# Patient Record
Sex: Male | Born: 1945 | Race: White | Hispanic: No | Marital: Married | Smoking: Never smoker
Health system: Southern US, Community
[De-identification: ages and names within clinical notes are randomized; demographics above are authoritative.]

## PROBLEM LIST (undated history)

## (undated) DIAGNOSIS — I10 Essential (primary) hypertension: Secondary | ICD-10-CM

## (undated) DIAGNOSIS — J309 Allergic rhinitis, unspecified: Secondary | ICD-10-CM

## (undated) DIAGNOSIS — I219 Acute myocardial infarction, unspecified: Secondary | ICD-10-CM

## (undated) DIAGNOSIS — E7800 Pure hypercholesterolemia, unspecified: Secondary | ICD-10-CM

## (undated) DIAGNOSIS — E119 Type 2 diabetes mellitus without complications: Secondary | ICD-10-CM

## (undated) DIAGNOSIS — I2510 Atherosclerotic heart disease of native coronary artery without angina pectoris: Secondary | ICD-10-CM

## (undated) DIAGNOSIS — E785 Hyperlipidemia, unspecified: Secondary | ICD-10-CM

## (undated) HISTORY — PX: CORONARY ARTERY BYPASS GRAFT: SHX141

## (undated) HISTORY — DX: Pure hypercholesterolemia, unspecified: E78.00

## (undated) HISTORY — DX: Allergic rhinitis, unspecified: J30.9

## (undated) HISTORY — PX: CORONARY ANGIOPLASTY WITH STENT PLACEMENT: SHX49

---

## 2000-07-22 ENCOUNTER — Encounter: Payer: Self-pay | Admitting: Emergency Medicine

## 2000-07-22 ENCOUNTER — Inpatient Hospital Stay (HOSPITAL_COMMUNITY): Admission: EM | Admit: 2000-07-22 | Discharge: 2000-07-27 | Payer: Self-pay | Admitting: Emergency Medicine

## 2000-07-26 ENCOUNTER — Encounter: Payer: Self-pay | Admitting: Cardiology

## 2000-08-11 ENCOUNTER — Encounter (HOSPITAL_COMMUNITY): Admission: RE | Admit: 2000-08-11 | Discharge: 2000-11-09 | Payer: Self-pay | Admitting: Interventional Cardiology

## 2000-08-12 ENCOUNTER — Encounter: Admission: RE | Admit: 2000-08-12 | Discharge: 2000-11-10 | Payer: Self-pay | Admitting: Internal Medicine

## 2003-02-06 ENCOUNTER — Ambulatory Visit (HOSPITAL_COMMUNITY): Admission: RE | Admit: 2003-02-06 | Discharge: 2003-02-07 | Payer: Self-pay | Admitting: Interventional Cardiology

## 2003-02-07 ENCOUNTER — Encounter: Payer: Self-pay | Admitting: Interventional Cardiology

## 2003-02-10 ENCOUNTER — Inpatient Hospital Stay (HOSPITAL_COMMUNITY)
Admission: RE | Admit: 2003-02-10 | Discharge: 2003-02-14 | Payer: Self-pay | Admitting: Thoracic Surgery (Cardiothoracic Vascular Surgery)

## 2003-02-10 ENCOUNTER — Encounter: Payer: Self-pay | Admitting: Thoracic Surgery (Cardiothoracic Vascular Surgery)

## 2003-02-11 ENCOUNTER — Encounter: Payer: Self-pay | Admitting: Thoracic Surgery (Cardiothoracic Vascular Surgery)

## 2003-02-12 ENCOUNTER — Encounter: Payer: Self-pay | Admitting: Thoracic Surgery (Cardiothoracic Vascular Surgery)

## 2003-03-13 ENCOUNTER — Encounter (HOSPITAL_COMMUNITY): Admission: RE | Admit: 2003-03-13 | Discharge: 2003-06-11 | Payer: Self-pay | Admitting: Interventional Cardiology

## 2011-05-20 ENCOUNTER — Inpatient Hospital Stay (INDEPENDENT_AMBULATORY_CARE_PROVIDER_SITE_OTHER)
Admission: RE | Admit: 2011-05-20 | Discharge: 2011-05-20 | Disposition: A | Discharge status: Home / Self Care | Payer: Managed Care, Other (non HMO) | Source: Ambulatory Visit | Attending: Emergency Medicine | Admitting: Emergency Medicine

## 2011-05-20 DIAGNOSIS — M799 Soft tissue disorder, unspecified: Secondary | ICD-10-CM

## 2011-05-20 DIAGNOSIS — R7989 Other specified abnormal findings of blood chemistry: Secondary | ICD-10-CM

## 2011-05-20 LAB — GLUCOSE, CAPILLARY: Glucose-Capillary: 268 mg/dL — ABNORMAL HIGH (ref 70–99)

## 2013-06-27 ENCOUNTER — Emergency Department (HOSPITAL_COMMUNITY)
Admission: EM | Admit: 2013-06-27 | Discharge: 2013-06-28 | Disposition: A | Discharge status: Home / Self Care | Payer: Medicare Other | Attending: Emergency Medicine | Admitting: Emergency Medicine

## 2013-06-27 ENCOUNTER — Encounter (HOSPITAL_COMMUNITY): Payer: Self-pay

## 2013-06-27 ENCOUNTER — Emergency Department (HOSPITAL_COMMUNITY): Payer: Medicare Other

## 2013-06-27 ENCOUNTER — Emergency Department (INDEPENDENT_AMBULATORY_CARE_PROVIDER_SITE_OTHER)
Admission: EM | Admit: 2013-06-27 | Discharge: 2013-06-27 | Disposition: A | Discharge status: Nursing Home (Includes ICF) | Payer: Medicare Other | Source: Home / Self Care

## 2013-06-27 ENCOUNTER — Encounter (HOSPITAL_COMMUNITY): Payer: Self-pay | Admitting: *Deleted

## 2013-06-27 DIAGNOSIS — M949 Disorder of cartilage, unspecified: Secondary | ICD-10-CM

## 2013-06-27 DIAGNOSIS — M546 Pain in thoracic spine: Secondary | ICD-10-CM | POA: Insufficient documentation

## 2013-06-27 DIAGNOSIS — M549 Dorsalgia, unspecified: Secondary | ICD-10-CM

## 2013-06-27 DIAGNOSIS — M898X1 Other specified disorders of bone, shoulder: Secondary | ICD-10-CM

## 2013-06-27 DIAGNOSIS — E785 Hyperlipidemia, unspecified: Secondary | ICD-10-CM | POA: Insufficient documentation

## 2013-06-27 DIAGNOSIS — R197 Diarrhea, unspecified: Secondary | ICD-10-CM

## 2013-06-27 DIAGNOSIS — Z7982 Long term (current) use of aspirin: Secondary | ICD-10-CM | POA: Insufficient documentation

## 2013-06-27 DIAGNOSIS — M899 Disorder of bone, unspecified: Secondary | ICD-10-CM

## 2013-06-27 DIAGNOSIS — I2510 Atherosclerotic heart disease of native coronary artery without angina pectoris: Secondary | ICD-10-CM | POA: Insufficient documentation

## 2013-06-27 DIAGNOSIS — Z79899 Other long term (current) drug therapy: Secondary | ICD-10-CM | POA: Insufficient documentation

## 2013-06-27 DIAGNOSIS — E119 Type 2 diabetes mellitus without complications: Secondary | ICD-10-CM | POA: Insufficient documentation

## 2013-06-27 DIAGNOSIS — Z951 Presence of aortocoronary bypass graft: Secondary | ICD-10-CM | POA: Insufficient documentation

## 2013-06-27 DIAGNOSIS — R9431 Abnormal electrocardiogram [ECG] [EKG]: Secondary | ICD-10-CM | POA: Insufficient documentation

## 2013-06-27 DIAGNOSIS — I252 Old myocardial infarction: Secondary | ICD-10-CM | POA: Insufficient documentation

## 2013-06-27 HISTORY — DX: Essential (primary) hypertension: I10

## 2013-06-27 HISTORY — DX: Atherosclerotic heart disease of native coronary artery without angina pectoris: I25.10

## 2013-06-27 HISTORY — DX: Hyperlipidemia, unspecified: E78.5

## 2013-06-27 HISTORY — DX: Type 2 diabetes mellitus without complications: E11.9

## 2013-06-27 HISTORY — DX: Acute myocardial infarction, unspecified: I21.9

## 2013-06-27 LAB — BASIC METABOLIC PANEL
BUN: 53 mg/dL — ABNORMAL HIGH (ref 6–23)
Calcium: 9.5 mg/dL (ref 8.4–10.5)
Creatinine, Ser: 1.75 mg/dL — ABNORMAL HIGH (ref 0.50–1.35)
GFR calc Af Amer: 45 mL/min — ABNORMAL LOW (ref >90)

## 2013-06-27 LAB — CBC
HCT: 37.9 % — ABNORMAL LOW (ref 39.0–52.0)
MCH: 28.8 pg (ref 26.0–34.0)
MCHC: 33.8 g/dL (ref 30.0–36.0)
MCV: 85.2 fL (ref 78.0–100.0)
Platelets: 185 10*3/uL (ref 150–400)
RDW: 13.1 % (ref 11.5–15.5)

## 2013-06-27 LAB — POCT I-STAT TROPONIN I: Troponin i, poc: 0.04 ng/mL (ref 0.00–0.08)

## 2013-06-27 MED ORDER — SODIUM CHLORIDE 0.9 % IV SOLN
Freq: Once | INTRAVENOUS | Status: AC
  Administered 2013-06-27: 19:00:00 via INTRAVENOUS

## 2013-06-27 NOTE — ED Notes (Signed)
Over past 48 hrs, describes intermittent "debilitating, unbearable" squeezing pain in mid-upper back, just below neck; area is non-tender to palpation.  When severe, the pain does not change with any movement or position changes.  Denies any diaphoresis, nausea, or chest pain associated with it.  Pain lasts for approx 30 min until Advil starts working, then pain goes completely away for approx 3 hrs.  Also c/o intermittent episodes watery diarrhea over past 48 hrs without any n/v or fevers.  Has taken Imodium.  Is non-compliant with metformin due to stomach problems when he used to take it.

## 2013-06-27 NOTE — ED Provider Notes (Signed)
History    CSN: [id number] Arrival date & time 06/27/13  1741  First MD Initiated Contact with Patient 06/27/13 1825     Chief Complaint  Patient presents with  . Back Pain  . Diarrhea   (Consider location/radiation/quality/duration/timing/severity/associated sxs/prior Treatment) HPI Comments: 67 year old male with history of coronary artery disease, untreated type 2 diabetes mellitus, hypertension, hyperlipidemia presents with severe squeezing pain in the upper back between the shoulder blades with any exertion for the past 48 hours as well as intermittent diarrhea for the past 48 hours. He says this pain is debilitating and causes him to have to lay down. Takes Advil and the pain goes away after about 30 minutes. He has never experienced anything like this before. He says the pain occurs with any exertion and is relieved by rest. He denies chest pain, dizziness, nausea, and diaphoresis.  Patient is a 67 y.o. male presenting with back pain and diarrhea.  Back Pain Associated symptoms: no abdominal pain, no chest pain, no dysuria, no fever and no weakness   Diarrhea Associated symptoms: no abdominal pain, no arthralgias, no chills, no fever, no myalgias and no vomiting    Past Medical History  Diagnosis Date  . Coronary artery disease   . Acute MI     x2  . Hypertension   . Diabetes mellitus without complication   . Hyperlipidemia    Past Surgical History  Procedure Laterality Date  . Coronary artery bypass graft      multi-vessel 2004  . Coronary angioplasty with stent placement     No family history on file. History  Substance Use Topics  . Smoking status: Never Smoker   . Smokeless tobacco: Not on file  . Alcohol Use: No    Review of Systems  Constitutional: Negative for fever, chills and fatigue.  HENT: Negative for sore throat, neck pain and neck stiffness.   Eyes: Negative for visual disturbance.  Respiratory: Negative for cough and shortness of breath.    Cardiovascular: Negative for chest pain, palpitations and leg swelling.  Gastrointestinal: Positive for diarrhea. Negative for nausea, vomiting, abdominal pain and constipation.  Genitourinary: Negative for dysuria, urgency, frequency and hematuria.  Musculoskeletal: Positive for back pain. Negative for myalgias and arthralgias.  Skin: Negative for rash.  Neurological: Negative for dizziness, weakness and light-headedness.    Allergies  Review of patient's allergies indicates no known allergies.  Home Medications   Current Outpatient Rx  Name  Route  Sig  Dispense  Refill  . aspirin 81 MG tablet   Oral   Take 81 mg by mouth daily.         . hydrochlorothiazide (HYDRODIURIL) 25 MG tablet   Oral   Take 25 mg by mouth daily.         Marland Kitchen lisinopril (PRINIVIL,ZESTRIL) 40 MG tablet   Oral   Take 40 mg by mouth daily.         . metoprolol (LOPRESSOR) 50 MG tablet   Oral   Take 50 mg by mouth.         . simvastatin (ZOCOR) 40 MG tablet   Oral   Take 40 mg by mouth every evening.          BP 124/97  Pulse 71  Temp(Src) 98.6 F (37 C) (Oral)  Resp 18  SpO2 100% Physical Exam  Constitutional: He is oriented to person, place, and time. He appears well-developed and well-nourished. No distress.  HENT:  Head: Normocephalic and atraumatic.  Eyes: EOM are normal. Pupils are equal, round, and reactive to light.  Cardiovascular: Normal rate and regular rhythm.  Exam reveals no gallop and no friction rub.   No murmur heard. Midline scar from previous open thoracotomy from CABG  Pulmonary/Chest: Effort normal and breath sounds normal. No respiratory distress. He has no wheezes. He has no rales.  Neurological: He is oriented to person, place, and time.  Skin: Skin is warm and dry. No rash noted.  Psychiatric: He has a normal mood and affect. Judgment normal.    ED Course  Procedures (including critical care time) Labs Reviewed - No data to display No results found. 1.  Shoulder blade pain   2. Diarrhea    The EKG shows T-wave flattening, Q waves with borderline significant ST elevations and T2 and T3, with with T-wave inversions in 1 and aVL MDM  With this patient's history and EKG, he needs to be worked up in the emergency department for potential cardiac etiology of his pain. Transferring via [facility]  Graylon Good, PA-C 06/27/13 1854

## 2013-06-27 NOTE — ED Notes (Signed)
Report given to [facility]. 

## 2013-06-27 NOTE — ED Notes (Signed)
Patient presents to ED via [facility]. Pt was seen at Urgent Care for intermittent pain in upper back. Urgent care noted abnormalities to pt EKG. Pt transported to ED from Urgent care for same complaints. Pt states that the intermittent pain feels like his "muscles are twisting." A&O x4 upon arrival. EKG being performed at this time.

## 2013-06-27 NOTE — ED Provider Notes (Addendum)
History    CSN: [id number] Arrival date & time 06/27/13  1954  First MD Initiated Contact with Patient 06/27/13 1956     Chief Complaint  Patient presents with  . Back Pain  . Abnormal ECG   (Consider location/radiation/quality/duration/timing/severity/associated sxs/prior Treatment) HPI Comments: Patient GE for evaluation of intrascapular back pain. Patient initially presented to the urgent care S., was referred to me for further evaluation. Patient does have significant cardiac history including stenting followed by bypass in 2004. Patient has not had any anterior chest pain or shortness of breath. He reports a severe pain between his shoulder blades. It occurs with exertion and gets better if he sits down and rests. He reports that when the pain is present, it is not exacerbated by bending, twisting or pressing on the area. He also has noticed some improvement with Advil. Patient has not had a nausea, vomiting or diaphoresis. He has been expressing intermittent watery diarrhea.  Patient is a diabetic. He admits to not taking his metformin because it causes GI distress.  Patient is a 67 y.o. male presenting with back pain.  Back Pain Associated symptoms: no abdominal pain and no chest pain    Past Medical History  Diagnosis Date  . Coronary artery disease   . Acute MI     x2  . Hypertension   . Diabetes mellitus without complication   . Hyperlipidemia    Past Surgical History  Procedure Laterality Date  . Coronary artery bypass graft      multi-vessel 2004  . Coronary angioplasty with stent placement     No family history on file. History  Substance Use Topics  . Smoking status: Never Smoker   . Smokeless tobacco: Not on file  . Alcohol Use: No    Review of Systems  Respiratory: Negative for shortness of breath.   Cardiovascular: Negative for chest pain, palpitations and leg swelling.  Gastrointestinal: Negative for abdominal pain.  Musculoskeletal: Positive for  back pain.  All other systems reviewed and are negative.    Allergies  Review of patient's allergies indicates no known allergies.  Home Medications   Current Outpatient Rx  Name  Route  Sig  Dispense  Refill  . hydrochlorothiazide (HYDRODIURIL) 25 MG tablet   Oral   Take 25 mg by mouth daily.         Marland Kitchen lisinopril (PRINIVIL,ZESTRIL) 40 MG tablet   Oral   Take 40 mg by mouth daily.         . metoprolol (LOPRESSOR) 50 MG tablet   Oral   Take 50 mg by mouth.         . simvastatin (ZOCOR) 40 MG tablet   Oral   Take 40 mg by mouth every evening.          BP 149/75  Pulse 64  Temp(Src) 97.9 F (36.6 C) (Oral)  Resp 11  SpO2 97% Physical Exam  Constitutional: He is oriented to person, place, and time. He appears well-developed and well-nourished. No distress.  HENT:  Head: Normocephalic and atraumatic.  Right Ear: Hearing normal.  Left Ear: Hearing normal.  Nose: Nose normal.  Mouth/Throat: Oropharynx is clear and moist and mucous membranes are normal.  Eyes: Conjunctivae and EOM are normal. Pupils are equal, round, and reactive to light.  Neck: Normal range of motion. Neck supple.  Cardiovascular: Regular rhythm, S1 normal and S2 normal.  Exam reveals no gallop and no friction rub.   No murmur heard. Pulmonary/Chest: Effort  normal and breath sounds normal. No respiratory distress. He exhibits no tenderness.  Abdominal: Soft. Normal appearance and bowel sounds are normal. There is no hepatosplenomegaly. There is no tenderness. There is no rebound, no guarding, no tenderness at McBurney's point and negative Murphy's sign. No hernia.  Musculoskeletal: Normal range of motion.  Neurological: He is alert and oriented to person, place, and time. He has normal strength. No cranial nerve deficit or sensory deficit. Coordination normal. GCS eye subscore is 4. GCS verbal subscore is 5. GCS motor subscore is 6.  Skin: Skin is warm, dry and intact. No rash noted. No cyanosis.   Psychiatric: He has a normal mood and affect. His speech is normal and behavior is normal. Thought content normal.    ED Course  Procedures (including critical care time)  EKG:  Date: 06/27/2013  Rate: 60  Rhythm: normal sinus rhythm  QRS Axis: normal  Intervals: normal  ST/T Wave abnormalities: nonspecific ST/T changes and anterior Q waves  Conduction Disutrbances:none  Narrative Interpretation:   Old EKG Reviewed: none available    Labs Reviewed  CBC - Abnormal; Notable for the following:    Hemoglobin 12.8 (*)    HCT 37.9 (*)    All other components within normal limits  BASIC METABOLIC PANEL - Abnormal; Notable for the following:    Glucose, Bld 231 (*)    BUN 53 (*)    Creatinine, Ser 1.75 (*)    GFR calc non Af Amer 39 (*)    GFR calc Af Amer 45 (*)    All other components within normal limits  POCT I-STAT TROPONIN I   Dg Chest 2 View  06/28/2013   *RADIOLOGY REPORT*  Clinical Data: Diarrhea and interscapular pain.  CHEST - 2 VIEW  Comparison: None.  Findings: Two views of the chest demonstrate elevation of the right hemidiaphragm.  Lungs are clear bilaterally.  Heart size is within normal limits.  Median sternotomy wires present.  IMPRESSION: No acute chest abnormality.  Elevation of the right hemidiaphragm.   Original Report Authenticated By: Richarda Overlie, M.D.    Diagnosis: Intrascapular back pain  MDM  Patient presents to the ER for 2 days worth of intermittent pain in the center of his back, between the shoulder blades, that occurs with exertion. It improves with rest but also improves with Advil. Patient has a history of coronary artery disease, status post bypass in 2004. Patient's EKG was nonspecific. Troponin was negative.   Case discussed with Doctor Shirlee Latch, on call cardiology. Because the patient's pain is mainly exertional, not at rest and is not currently having any pain, Doctor Shirlee Latch felt that the patient had a second troponin if negative, but discharged  and followup in the office in the morning.    Gilda Crease, MD 06/27/13 1610  Gilda Crease, MD 06/28/13 [phone]

## 2013-06-27 NOTE — ED Notes (Signed)
Report called to Thayer Ohm, ED Charge RN.

## 2013-06-28 ENCOUNTER — Ambulatory Visit
Admission: RE | Admit: 2013-06-28 | Discharge: 2013-06-28 | Disposition: A | Discharge status: Home / Self Care | Payer: Medicare Other | Source: Ambulatory Visit | Attending: Interventional Cardiology | Admitting: Interventional Cardiology

## 2013-06-28 ENCOUNTER — Other Ambulatory Visit: Payer: Self-pay | Admitting: Interventional Cardiology

## 2013-06-28 DIAGNOSIS — M1990 Unspecified osteoarthritis, unspecified site: Secondary | ICD-10-CM

## 2013-06-28 NOTE — ED Notes (Signed)
MD Pollina at bedside to update patient

## 2013-06-29 NOTE — ED Provider Notes (Signed)
Medical screening examination/treatment/procedure(s) were performed by a resident physician or non-physician practitioner and as the supervising physician I was immediately available for consultation/collaboration.  Clementeen Graham, MD    Rodolph Bong, MD 06/29/13 [phone]

## 2013-09-10 ENCOUNTER — Other Ambulatory Visit: Payer: Self-pay | Admitting: Interventional Cardiology

## 2013-09-10 DIAGNOSIS — E7800 Pure hypercholesterolemia, unspecified: Secondary | ICD-10-CM

## 2013-09-19 ENCOUNTER — Other Ambulatory Visit: Payer: Medicare Other | Admitting: *Deleted

## 2013-09-19 ENCOUNTER — Other Ambulatory Visit: Payer: Self-pay | Admitting: Cardiology

## 2013-09-19 DIAGNOSIS — E7800 Pure hypercholesterolemia, unspecified: Secondary | ICD-10-CM

## 2013-09-21 ENCOUNTER — Ambulatory Visit (INDEPENDENT_AMBULATORY_CARE_PROVIDER_SITE_OTHER): Payer: Medicare Other | Admitting: *Deleted

## 2013-09-21 DIAGNOSIS — E7800 Pure hypercholesterolemia, unspecified: Secondary | ICD-10-CM

## 2013-09-21 LAB — LIPID PANEL
Cholesterol: 146 mg/dL (ref 0–200)
HDL: 32.7 mg/dL — ABNORMAL LOW (ref >39.00)
LDL Cholesterol: 92 mg/dL (ref 0–99)
Total CHOL/HDL Ratio: 4
Triglycerides: 108 mg/dL (ref 0.0–149.0)
VLDL: 21.6 mg/dL (ref 0.0–40.0)

## 2013-09-21 LAB — ALT: ALT: 17 U/L (ref 0–53)

## 2013-09-22 ENCOUNTER — Other Ambulatory Visit: Payer: Medicare Other

## 2013-09-23 ENCOUNTER — Other Ambulatory Visit: Payer: Medicare Other

## 2014-01-10 ENCOUNTER — Encounter: Payer: Self-pay | Admitting: *Deleted

## 2014-01-10 ENCOUNTER — Encounter: Payer: Self-pay | Admitting: Interventional Cardiology

## 2014-01-10 DIAGNOSIS — I10 Essential (primary) hypertension: Secondary | ICD-10-CM | POA: Insufficient documentation

## 2014-01-10 DIAGNOSIS — E785 Hyperlipidemia, unspecified: Secondary | ICD-10-CM | POA: Insufficient documentation

## 2014-01-10 DIAGNOSIS — E119 Type 2 diabetes mellitus without complications: Secondary | ICD-10-CM | POA: Insufficient documentation

## 2014-01-10 DIAGNOSIS — I252 Old myocardial infarction: Secondary | ICD-10-CM | POA: Insufficient documentation

## 2014-01-10 DIAGNOSIS — I2510 Atherosclerotic heart disease of native coronary artery without angina pectoris: Secondary | ICD-10-CM | POA: Insufficient documentation

## 2014-01-19 ENCOUNTER — Encounter (INDEPENDENT_AMBULATORY_CARE_PROVIDER_SITE_OTHER): Payer: Self-pay

## 2014-01-19 ENCOUNTER — Encounter: Payer: Self-pay | Admitting: Interventional Cardiology

## 2014-01-19 ENCOUNTER — Ambulatory Visit (INDEPENDENT_AMBULATORY_CARE_PROVIDER_SITE_OTHER): Payer: Medicare HMO | Admitting: Interventional Cardiology

## 2014-01-19 ENCOUNTER — Other Ambulatory Visit (INDEPENDENT_AMBULATORY_CARE_PROVIDER_SITE_OTHER): Payer: Medicare HMO

## 2014-01-19 VITALS — BP 134/82 | HR 61 | Ht 71.0 in | Wt 245.0 lb

## 2014-01-19 DIAGNOSIS — E785 Hyperlipidemia, unspecified: Secondary | ICD-10-CM

## 2014-01-19 DIAGNOSIS — R0609 Other forms of dyspnea: Secondary | ICD-10-CM

## 2014-01-19 DIAGNOSIS — I5032 Chronic diastolic (congestive) heart failure: Secondary | ICD-10-CM

## 2014-01-19 DIAGNOSIS — I2510 Atherosclerotic heart disease of native coronary artery without angina pectoris: Secondary | ICD-10-CM

## 2014-01-19 DIAGNOSIS — R609 Edema, unspecified: Secondary | ICD-10-CM

## 2014-01-19 DIAGNOSIS — E7800 Pure hypercholesterolemia, unspecified: Secondary | ICD-10-CM

## 2014-01-19 DIAGNOSIS — R0989 Other specified symptoms and signs involving the circulatory and respiratory systems: Secondary | ICD-10-CM

## 2014-01-19 DIAGNOSIS — R0600 Dyspnea, unspecified: Secondary | ICD-10-CM

## 2014-01-19 DIAGNOSIS — I5042 Chronic combined systolic (congestive) and diastolic (congestive) heart failure: Secondary | ICD-10-CM | POA: Insufficient documentation

## 2014-01-19 DIAGNOSIS — E119 Type 2 diabetes mellitus without complications: Secondary | ICD-10-CM

## 2014-01-19 DIAGNOSIS — R600 Localized edema: Secondary | ICD-10-CM | POA: Insufficient documentation

## 2014-01-19 LAB — LIPID PANEL
CHOL/HDL RATIO: 4
Cholesterol: 126 mg/dL (ref 0–200)
HDL: 32.5 mg/dL — AB (ref >39.00)
LDL Cholesterol: 76 mg/dL (ref 0–99)
Triglycerides: 86 mg/dL (ref 0.0–149.0)
VLDL: 17.2 mg/dL (ref 0.0–40.0)

## 2014-01-19 LAB — BASIC METABOLIC PANEL
BUN: 23 mg/dL (ref 6–23)
CHLORIDE: 106 meq/L (ref 96–112)
CO2: 27 mEq/L (ref 19–32)
Calcium: 8.8 mg/dL (ref 8.4–10.5)
Creatinine, Ser: 1.2 mg/dL (ref 0.4–1.5)
GFR: 62.48 mL/min (ref >60.00)
Glucose, Bld: 246 mg/dL — ABNORMAL HIGH (ref 70–99)
Potassium: 4.2 mEq/L (ref 3.5–5.1)
SODIUM: 140 meq/L (ref 135–145)

## 2014-01-19 LAB — ALT: ALT: 18 U/L (ref 0–53)

## 2014-01-19 LAB — BRAIN NATRIURETIC PEPTIDE: PRO B NATRI PEPTIDE: 341 pg/mL — AB (ref 0.0–100.0)

## 2014-01-19 MED ORDER — METOPROLOL TARTRATE 50 MG PO TABS: 50.0000 mg | ORAL_TABLET | Freq: Two times a day (BID) | ORAL | Status: DC

## 2014-01-19 MED ORDER — SIMVASTATIN 40 MG PO TABS: 20.0000 mg | ORAL_TABLET | Freq: Every evening | ORAL | Status: DC

## 2014-01-19 MED ORDER — FUROSEMIDE 40 MG PO TABS: 40.0000 mg | ORAL_TABLET | Freq: Every day | ORAL | Status: DC

## 2014-01-19 MED ORDER — LISINOPRIL 40 MG PO TABS: 20.0000 mg | ORAL_TABLET | Freq: Every day | ORAL | Status: AC

## 2014-01-19 NOTE — Patient Instructions (Signed)
STOP Hctz  START Furosemide 40mg  twice daily for 1 week. Then reduce to 40mg  daily.  Your medications have been refilled today  Labs Today: Bmet, Bnp  Your physician has requested that you have an echocardiogram. Echocardiography is a painless test that uses sound waves to create images of your heart. It provides your doctor with information about the size and shape of your heart and how well your heart's chambers and valves are working. This procedure takes approximately one hour. There are no restrictions for this procedure.  Your physician recommends that you schedule a follow-up appointment in: 2 weks with a PA, or NP

## 2014-01-19 NOTE — Progress Notes (Signed)
Patient ID: Hayden Patellalbert Streets, male   DOB: 06/24/1946, 68 y.o.   MRN: [id number]    [address] [place], [place]  [address] Phone: [phone] Fax:  [phone]  Date:  01/19/2014   ID:  Hayden Patellalbert Ripple, DOB 03/13/1946, MRN [id number]  PCP:  No PCP Per Patient   ASSESSMENT:  1. Coronary atherosclerotic heart disease, status post bypass surgery 2. Hypertension, 3. Dyspnea 4. Bilateral lower extremity edema, severe. Rule out pulmonary hypertension  PLAN:  1. Discontinue hydrochlorothiazide and start furosemide 40 mg twice a day and report back about lower extremity swelling in 7 days. 2. Basic metabolic panel and BNP 3. 2-D Doppler echocardiogram to assess for pulmonary hypertension 4. Basic metabolic panel on return 5. 2-3 week followup appointment    SUBJECTIVE: Hayden Wallace is a 68 y.o. male who has a history of CAD and is status post coronary bypass surgery. There has been progressive lower extremity swelling over the past 6 months since he retired from [employer]. He denies chest pain. There is no orthopnea. There is significant lower extremity swelling. He has exertional fatigue. Is not able to exercise .   Wt Readings from Last 3 Encounters:  01/19/14 245 lb (111.131 kg)     Past Medical History  Diagnosis Date  . Coronary artery disease   . Acute MI     x2  . Hypertension   . Diabetes mellitus without complication   . Hyperlipidemia   . Hypercholesteremia   . Allergic rhinitis     Current Outpatient Prescriptions  Medication Sig Dispense Refill  . aspirin EC 81 MG tablet Take 81 mg by mouth daily.      . hydrochlorothiazide (HYDRODIURIL) 25 MG tablet Take 25 mg by mouth daily.      Marland Kitchen. ibuprofen (ADVIL,MOTRIN) 200 MG tablet Take 400 mg by mouth daily as needed for pain.      Marland Kitchen. lisinopril (PRINIVIL,ZESTRIL) 40 MG tablet Take 20 mg by mouth daily.       Marland Kitchen. loperamide (IMODIUM A-D) 2 MG tablet Take 2 mg by mouth 3 (three) times daily as needed for diarrhea  or loose stools.      . metoprolol (LOPRESSOR) 50 MG tablet Take 50 mg by mouth 2 (two) times daily.       . simvastatin (ZOCOR) 40 MG tablet Take 20 mg by mouth every evening.        No current facility-administered medications for this visit.    Allergies:   No Known Allergies  Social History:  The patient  reports that he has never smoked. He does not have any smokeless tobacco history on file. He reports that he does not drink alcohol or use illicit drugs.   ROS:  Please see the history of present illness.   Denies orthopnea. No syncope or palpitations.   All other systems reviewed and negative.   OBJECTIVE: VS:  BP 134/82  Pulse 61  Ht 5\' 11"  (1.803 m)  Wt 245 lb (111.131 kg)  BMI 34.19 kg/m2 Well nourished, well developed, in no acute distress, perfusion than stated age  HEENT: normal Neck: JVD marked elevation tidal of the jaw sitting at 90. Carotid bruit absent  Cardiac:  normal S1, S2; RRR; no murmur Lungs:  clear to auscultation bilaterally, no wheezing, rhonchi or rales Abd: soft, nontender, no hepatomegaly Ext: Edema 4+ bilateral lower extremity edema from ankles to the knees bilaterally. Pulses unable to palpate do to edema  Skin: warm and dry Neuro:  CNs 2-12 intact, no focal abnormalities noted  EKG:  Not performed       Signed, Darci Needle III, MD 01/19/2014 10:11 AM

## 2014-01-24 ENCOUNTER — Other Ambulatory Visit: Payer: Self-pay | Admitting: Interventional Cardiology

## 2014-01-24 ENCOUNTER — Telehealth: Payer: Self-pay

## 2014-01-24 DIAGNOSIS — E785 Hyperlipidemia, unspecified: Secondary | ICD-10-CM

## 2014-01-24 NOTE — Telephone Encounter (Signed)
Message copied by Jarvis NewcomerPARRIS-GODLEY, Mechelle Pates S on Tue Jan 24, 2014  3:36 PM ------      Message from: Verdis PrimeSMITH, HENRY      Created: Fri Jan 20, 2014 11:05 AM       Normal. Repeat in one year ------

## 2014-01-24 NOTE — Telephone Encounter (Signed)
pt home/cell disconnected. unable to get sime one on the line at pt work #. results mailed to pt.

## 2014-01-27 ENCOUNTER — Ambulatory Visit (HOSPITAL_COMMUNITY): Payer: Medicare HMO | Attending: Cardiology | Admitting: Cardiology

## 2014-01-27 ENCOUNTER — Encounter: Payer: Self-pay | Admitting: Cardiology

## 2014-01-27 DIAGNOSIS — I059 Rheumatic mitral valve disease, unspecified: Secondary | ICD-10-CM | POA: Insufficient documentation

## 2014-01-27 DIAGNOSIS — I5032 Chronic diastolic (congestive) heart failure: Secondary | ICD-10-CM

## 2014-01-27 DIAGNOSIS — E119 Type 2 diabetes mellitus without complications: Secondary | ICD-10-CM | POA: Insufficient documentation

## 2014-01-27 DIAGNOSIS — I252 Old myocardial infarction: Secondary | ICD-10-CM

## 2014-01-27 DIAGNOSIS — R600 Localized edema: Secondary | ICD-10-CM

## 2014-01-27 DIAGNOSIS — E785 Hyperlipidemia, unspecified: Secondary | ICD-10-CM | POA: Insufficient documentation

## 2014-01-27 DIAGNOSIS — R0989 Other specified symptoms and signs involving the circulatory and respiratory systems: Principal | ICD-10-CM | POA: Insufficient documentation

## 2014-01-27 DIAGNOSIS — Z951 Presence of aortocoronary bypass graft: Secondary | ICD-10-CM | POA: Insufficient documentation

## 2014-01-27 DIAGNOSIS — I10 Essential (primary) hypertension: Secondary | ICD-10-CM | POA: Insufficient documentation

## 2014-01-27 DIAGNOSIS — R0609 Other forms of dyspnea: Secondary | ICD-10-CM | POA: Insufficient documentation

## 2014-01-27 DIAGNOSIS — I2510 Atherosclerotic heart disease of native coronary artery without angina pectoris: Secondary | ICD-10-CM | POA: Insufficient documentation

## 2014-01-27 NOTE — Progress Notes (Signed)
Echo performed. 

## 2014-02-02 ENCOUNTER — Encounter: Payer: Medicare HMO | Admitting: Physician Assistant

## 2014-02-02 ENCOUNTER — Encounter: Payer: Self-pay | Admitting: Interventional Cardiology

## 2014-02-02 ENCOUNTER — Encounter: Payer: Self-pay | Admitting: Physician Assistant

## 2014-02-02 ENCOUNTER — Ambulatory Visit (INDEPENDENT_AMBULATORY_CARE_PROVIDER_SITE_OTHER): Payer: Medicare HMO | Admitting: Interventional Cardiology

## 2014-02-02 VITALS — BP 138/74 | HR 63 | Ht 71.0 in | Wt 249.0 lb

## 2014-02-02 DIAGNOSIS — I5032 Chronic diastolic (congestive) heart failure: Secondary | ICD-10-CM

## 2014-02-02 DIAGNOSIS — I2510 Atherosclerotic heart disease of native coronary artery without angina pectoris: Secondary | ICD-10-CM

## 2014-02-02 DIAGNOSIS — E785 Hyperlipidemia, unspecified: Secondary | ICD-10-CM

## 2014-02-02 DIAGNOSIS — R600 Localized edema: Secondary | ICD-10-CM

## 2014-02-02 DIAGNOSIS — R609 Edema, unspecified: Secondary | ICD-10-CM

## 2014-02-02 DIAGNOSIS — I252 Old myocardial infarction: Secondary | ICD-10-CM

## 2014-02-02 DIAGNOSIS — I255 Ischemic cardiomyopathy: Secondary | ICD-10-CM | POA: Insufficient documentation

## 2014-02-02 LAB — BASIC METABOLIC PANEL
BUN: 23 mg/dL (ref 6–23)
CHLORIDE: 103 meq/L (ref 96–112)
CO2: 28 mEq/L (ref 19–32)
Calcium: 8.2 mg/dL — ABNORMAL LOW (ref 8.4–10.5)
Creatinine, Ser: 1.4 mg/dL (ref 0.4–1.5)
GFR: 55.16 mL/min — AB (ref >60.00)
Glucose, Bld: 308 mg/dL — ABNORMAL HIGH (ref 70–99)
Potassium: 3.7 mEq/L (ref 3.5–5.1)
Sodium: 136 mEq/L (ref 135–145)

## 2014-02-02 LAB — LIPID PANEL
Cholesterol: 131 mg/dL (ref 0–200)
HDL: 28.7 mg/dL — ABNORMAL LOW (ref >39.00)
LDL CALC: 75 mg/dL (ref 0–99)
Total CHOL/HDL Ratio: 5
Triglycerides: 138 mg/dL (ref 0.0–149.0)
VLDL: 27.6 mg/dL (ref 0.0–40.0)

## 2014-02-02 LAB — ALT: ALT: 20 U/L (ref 0–53)

## 2014-02-02 LAB — TSH: TSH: 3.23 u[IU]/mL (ref 0.35–5.50)

## 2014-02-02 MED ORDER — FUROSEMIDE 80 MG PO TABS: 80.0000 mg | ORAL_TABLET | Freq: Every day | ORAL | Status: DC

## 2014-02-02 MED ORDER — POTASSIUM CHLORIDE CRYS ER 20 MEQ PO TBCR: 20.0000 meq | EXTENDED_RELEASE_TABLET | Freq: Every day | ORAL | Status: DC

## 2014-02-02 NOTE — Progress Notes (Deleted)
[address] [place], [place]  [address] Phone: [phone] Fax:  [phone]  Date:  02/02/2014   ID:  Hayden Wallace, DOB 12/15/1945, MRN [id number]  PCP:  No PCP Per Patient  Cardiologist:  Dr. Verdis PrimeHenry Wallace     History of Present Illness: Hayden Wallace is a 68 y.o. male with a hx of CAD, s/p prior anterior MI with stenting to the LAD and subsequent CABG in 2004, ischemic cardiomyopathy, diabetes, HTN, HL.  Patient was recently seen by Dr. Katrinka Wallace 01/19/14 with increasing LE edema. HCTZ was discontinued and he was placed back on Lasix. Echocardiogram demonstrated an ejection fraction of 40-45%.  ***  ETT-Cardiolite (08/2002): Anteroapical infarct, no ischemia, EF 35% with anteroapical akinesis. Abdominal US (07/2012): No AAA. Echocardiogram (01/27/14): Mild LVH, EF 40-45%, mid anteroseptal, apical septal, apical inferior and true apex akinesis, grade 2 diastolic dysfunction, mildly dilated aortic root (36 mm), MAC, mild MR, mild LAE.     Recent Labs: 06/27/2013: Hemoglobin 12.8*  01/19/2014: ALT 18; Creatinine 1.2; HDL Cholesterol 32.50*; LDL (calc) 76; Potassium 4.2; Pro B Natriuretic peptide (BNP) 341.0*   Wt Readings from Last 3 Encounters:  02/02/14 249 lb (112.946 kg)  01/19/14 245 lb (111.131 kg)     Past Medical History  Diagnosis Date  . Coronary artery disease   . Acute MI     x2  . Hypertension   . Diabetes mellitus without complication   . Hyperlipidemia   . Hypercholesteremia   . Allergic rhinitis     Past Surgical History  Procedure Laterality Date  . Coronary artery bypass graft      multi-vessel 2004  . Coronary angioplasty with stent placement      Current Outpatient Prescriptions  Medication Sig Dispense Refill  . aspirin EC 81 MG tablet Take 81 mg by mouth daily.      . furosemide (LASIX) 40 MG tablet Take 1 tablet (40 mg total) by mouth daily.  30 tablet  11  . ibuprofen (ADVIL,MOTRIN) 200 MG tablet Take 400 mg by mouth daily as needed for  pain.      Marland Kitchen. lisinopril (PRINIVIL,ZESTRIL) 40 MG tablet Take 0.5 tablets (20 mg total) by mouth daily.  15 tablet  11  . loperamide (IMODIUM A-D) 2 MG tablet Take 2 mg by mouth 3 (three) times daily as needed for diarrhea or loose stools.      . metoprolol (LOPRESSOR) 50 MG tablet Take 1 tablet (50 mg total) by mouth 2 (two) times daily.  180 tablet  3  . simvastatin (ZOCOR) 40 MG tablet Take 0.5 tablets (20 mg total) by mouth every evening.  15 tablet  11   No current facility-administered medications for this visit.    Allergies:   Review of patient's allergies indicates no known allergies.   Social History:  The patient  reports that he has never smoked. He does not have any smokeless tobacco history on file. He reports that he does not drink alcohol or use illicit drugs.   Family History:  The patient's family history includes Heart disease in his father and mother.   ROS:  Please see the history of present illness.   ***   All other systems reviewed and negative.   PHYSICAL EXAM: VS:  BP 138/74  Pulse 63  Ht 5\' 11"  (1.803 m)  Wt 249 lb (112.946 kg)  BMI 34.74 kg/m2 Well nourished, well developed, in no acute distress HEENT: normal Neck: no JVD Cardiac:  normal  S1, S2; RRR; no murmur Lungs:  clear to auscultation bilaterally, no wheezing, rhonchi or rales Abd: soft, nontender, no hepatomegaly Ext: no edema Skin: warm and dry Neuro:  CNs 2-12 intact, no focal abnormalities noted  EKG:  ***     ASSESSMENT AND PLAN:  1. *** 2. CAD, s/p Prior MI:  *** 3. Ischemic Cardiomyopathy:  *** 4. Hypertension:  *** 5. Hyperlipidemia:  *** 6. Disposition:  ***  Signed, Tereso Newcomer, PA-C  02/02/2014 8:48 AM

## 2014-02-02 NOTE — Progress Notes (Signed)
Patient ID: Hayden Wallace, male   DOB: 1946-05-09, 68 y.o.   MRN: [id number]    [address] [place], [place]  [address] Phone: [phone] Fax:  [phone]  Date:  02/02/2014   ID:  Hayden Wallace, DOB 09-May-1946, MRN [id number]  PCP:  No PCP Per Patient   ASSESSMENT:  1. Acute on chronic diastolic heart failure, without dyspnea 2. Hypertension 3. Coronary atherosclerosis, without angina 4. Significant bilateral lower extremity edema  PLAN:  1. Change furosemide to 80 mg daily 2. Add K Dur 20 mEq per day 3. Elevate legs as much as possible 4. Basic metabolic panel and TSH today 5. Clinical followup in one month with a be met. 6. 2 g sodium restriction and less than 2 L fluid restriction   SUBJECTIVE: Hayden Wallace is a 68 y.o. male who continues to complain of exertional fatigue and significant lower extremity swelling. Dyspnea is not a major complaint. Despite starting furosemide and using 40 mg twice daily, he has noted an increase in weight. He has not observed any fluid or sodium restriction. He denies angina. He denies lightheadedness and dizziness.   Wt Readings from Last 3 Encounters:  02/02/14 249 lb (112.946 kg)  01/19/14 245 lb (111.131 kg)     Past Medical History  Diagnosis Date  . Coronary artery disease   . Acute MI     x2  . Hypertension   . Diabetes mellitus without complication   . Hyperlipidemia   . Hypercholesteremia   . Allergic rhinitis     Current Outpatient Prescriptions  Medication Sig Dispense Refill  . aspirin EC 81 MG tablet Take 81 mg by mouth daily.      . furosemide (LASIX) 40 MG tablet Take 1 tablet (40 mg total) by mouth daily.  30 tablet  11  . ibuprofen (ADVIL,MOTRIN) 200 MG tablet Take 400 mg by mouth daily as needed for pain.      Marland Kitchen lisinopril (PRINIVIL,ZESTRIL) 40 MG tablet Take 0.5 tablets (20 mg total) by mouth daily.  15 tablet  11  . loperamide (IMODIUM A-D) 2 MG tablet Take 2 mg by mouth 3 (three) times  daily as needed for diarrhea or loose stools.      . metoprolol (LOPRESSOR) 50 MG tablet Take 1 tablet (50 mg total) by mouth 2 (two) times daily.  180 tablet  3  . simvastatin (ZOCOR) 40 MG tablet Take 0.5 tablets (20 mg total) by mouth every evening.  15 tablet  11   No current facility-administered medications for this visit.    Allergies:   No Known Allergies  Social History:  The patient  reports that he has never smoked. He does not have any smokeless tobacco history on file. He reports that he does not drink alcohol or use illicit drugs.   ROS:  Please see the history of present illness.   No dietary constraints. Denies angina. Denies orthopnea.  States his wife complains that he snores. He has been sedentary. All other systems reviewed and negative.   OBJECTIVE: VS:  There were no vitals taken for this visit. Well nourished, well developed, in no acute distress, marked bilateral lower extremity swelling to above the knee. HEENT: normal Neck: JVD moderate distention to near the angle of the jaw while sitting. Carotid bruit absent  Cardiac:  normal S1, S2; RRR; no murmur Lungs:  clear to auscultation bilaterally, no wheezing, rhonchi or rales Abd: soft, nontender, no hepatomegaly Ext: Edema 3+ bilateral  from the ankles to above the knee. Pulses difficult to palpate Skin: warm and dry Neuro:  CNs 2-12 intact, no focal abnormalities noted  EKG:  Poor R. wave progression V1 through the 4 with nonspecific ST-T wave abnormality       Signed, Illene Labrador III, MD 02/02/2014 8:47 AM

## 2014-02-02 NOTE — Progress Notes (Signed)
Patient ID: Hayden Wallace, male   DOB: 06/18/1946, 68 y.o.   MRN: [id number]

## 2014-02-02 NOTE — Patient Instructions (Signed)
Your physician has recommended you make the following change in your medication:  1) INCREASE Lasix to 80mg  daily. An Rx has been sent to your pharmacy 2) START Potassium 20meq daily. An Rx has been sent to your pharmacy 3) You have been given an Rx for Moderate knee high stockings to be worn daily  Take all other medication as prescribed  Call the office in 1 week to update us on your weight.[phone].  Lab Today: Bmet, Tsh  Please restrict your fluid intake to less than 2 liters daily  You have a follow up appt scheduled for 02/27/14 @4 :15pm  Please monitor your sodium intake  2 Gram Low Sodium Diet A 2 gram sodium diet restricts the amount of sodium in the diet to no more than 2 g or 2000 mg daily. Limiting the amount of sodium is often used to help lower blood pressure. It is important if you have heart, liver, or kidney problems. Many foods contain sodium for flavor and sometimes as a preservative. When the amount of sodium in a diet needs to be low, it is important to know what to look for when choosing foods and drinks. The following includes some information and guidelines to help make it easier for you to adapt to a low sodium diet. QUICK TIPS  Do not add salt to food.  Avoid convenience items and fast food.  Choose unsalted snack foods.  Buy lower sodium products, often labeled as "lower sodium" or "no salt added."  Check food labels to learn how much sodium is in 1 serving.  When eating at a restaurant, ask that your food be prepared with less salt or none, if possible. READING FOOD LABELS FOR SODIUM INFORMATION The nutrition facts label is a good place to find how much sodium is in foods. Look for products with no more than 500 to 600 mg of sodium per meal and no more than 150 mg per serving. Remember that 2 g = 2000 mg. The food label may also list foods as:  Sodium-free: Less than 5 mg in a serving.  Very low sodium: 35 mg or less in a serving.  Low-sodium:  140 mg or less in a serving.  Light in sodium: 50% less sodium in a serving. For example, if a food that usually has 300 mg of sodium is changed to become light in sodium, it will have 150 mg of sodium.  Reduced sodium: 25% less sodium in a serving. For example, if a food that usually has 400 mg of sodium is changed to reduced sodium, it will have 300 mg of sodium. CHOOSING FOODS Grains  Avoid: Salted crackers and snack items. Some cereals, including instant hot cereals. Bread stuffing and biscuit mixes. Seasoned rice or pasta mixes.  Choose: Unsalted snack items. Low-sodium cereals, oats, puffed wheat and rice, shredded wheat. English muffins and bread. Pasta. Meats  Avoid: Salted, canned, smoked, spiced, pickled meats, including fish and poultry. Bacon, ham, sausage, cold cuts, hot dogs, anchovies.  Choose: Low-sodium canned tuna and salmon. Fresh or frozen meat, poultry, and fish. Dairy  Avoid: Processed cheese and spreads. Cottage cheese. Buttermilk and condensed milk. Regular cheese.  Choose: Milk. Low-sodium cottage cheese. Yogurt. Sour cream. Low-sodium cheese. Fruits and Vegetables  Avoid: Regular canned vegetables. Regular canned tomato sauce and paste. Frozen vegetables in sauces. Olives. Rosita FirePickles. Relishes. Sauerkraut.  Choose: Low-sodium canned vegetables. Low-sodium tomato sauce and paste. Frozen or fresh vegetables. Fresh and frozen fruit. Condiments  Avoid: Canned and packaged gravies.  Worcestershire sauce. Tartar sauce. Barbecue sauce. Soy sauce. Steak sauce. Ketchup. Onion, garlic, and table salt. Meat flavorings and tenderizers.  Choose: Fresh and dried herbs and spices. Low-sodium varieties of mustard and ketchup. Lemon juice. Tabasco sauce. Horseradish. SAMPLE 2 GRAM SODIUM MEAL PLAN Breakfast / Sodium (mg)  1 cup low-fat milk / 143 mg  2 slices whole-wheat toast / 270 mg  1 tbs heart-healthy margarine / 153 mg  1 hard-boiled egg / 139 mg  1 small orange /  0 mg Lunch / Sodium (mg)  1 cup raw carrots / 76 mg   cup hummus / 298 mg  1 cup low-fat milk / 143 mg   cup red grapes / 2 mg  1 whole-wheat pita bread / 356 mg Dinner / Sodium (mg)  1 cup whole-wheat pasta / 2 mg  1 cup low-sodium tomato sauce / 73 mg  3 oz lean ground beef / 57 mg  1 small side salad (1 cup raw spinach leaves,  cup cucumber,  cup yellow bell pepper) with 1 tsp olive oil and 1 tsp red wine vinegar / 25 mg Snack / Sodium (mg)  1 container low-fat vanilla yogurt / 107 mg  3 graham cracker squares / 127 mg Nutrient Analysis  Calories: 2033  Protein: 77 g  Carbohydrate: 282 g  Fat: 72 g  Sodium: 1971 mg Document Released: 12/01/2005 Document Revised: 02/23/2012 Document Reviewed: 03/04/2010 Exit[employer] Patient Information 2014 [place], [place].

## 2014-02-06 NOTE — Progress Notes (Signed)
This encounter was created in error - please disregard.

## 2014-02-10 ENCOUNTER — Telehealth: Payer: Self-pay

## 2014-02-10 NOTE — Telephone Encounter (Signed)
lmom.Labs at target

## 2014-02-10 NOTE — Telephone Encounter (Signed)
Message copied by Jarvis NewcomerPARRIS-GODLEY, [place] Thresher S on Fri Feb 10, 2014 10:00 AM ------      Message from: Verdis PrimeSMITH, HENRY      Created: Wed Feb 08, 2014  2:58 PM       Labs at target ------

## 2014-02-10 NOTE — Telephone Encounter (Signed)
Follow up    Patient calling letting MD know.     Medication working . Not happening quickly as he like .

## 2014-02-23 ENCOUNTER — Telehealth: Payer: Self-pay | Admitting: Interventional Cardiology

## 2014-02-23 NOTE — Telephone Encounter (Signed)
New Prob   Calling to verify Lasix dosage. Please call.

## 2014-02-23 NOTE — Telephone Encounter (Signed)
returned call to pt pharmacy.pt lasix was increased to 80mg  daily at last o/v with Dr.Smith 02/02/14. Pharmacy aware

## 2014-03-02 ENCOUNTER — Ambulatory Visit: Payer: Medicare HMO | Admitting: Interventional Cardiology

## 2014-03-23 ENCOUNTER — Other Ambulatory Visit: Payer: Self-pay | Admitting: *Deleted

## 2014-03-23 MED ORDER — SIMVASTATIN 40 MG PO TABS: 20.0000 mg | ORAL_TABLET | Freq: Every evening | ORAL | Status: DC

## 2014-05-03 IMAGING — CR DG CERVICAL SPINE COMPLETE 4+V
6 series · 6 of 6 positions shown · non-contrast
Comparison: None.

CLINICAL DATA: Neck and left shoulder pain, no injury

CERVICAL SPINE - COMPLETE 4+ VIEW

[w c-spine lat *]
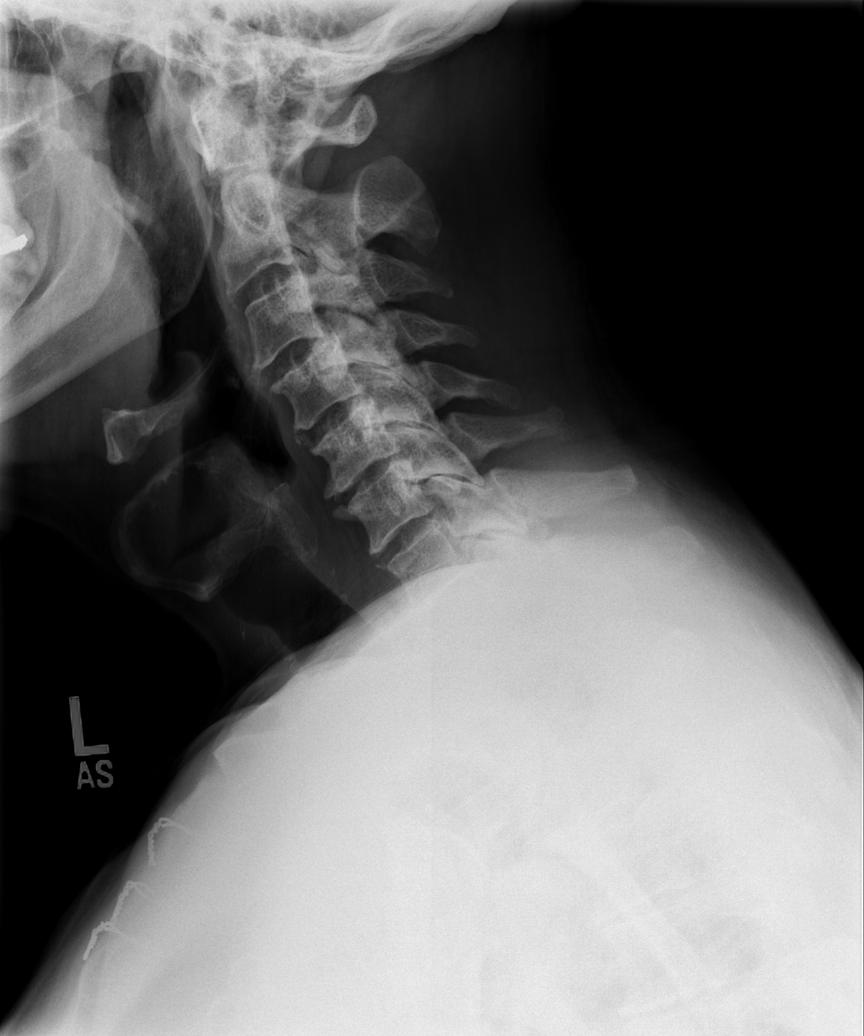

[w c-spine oblique * (1 of 2)]
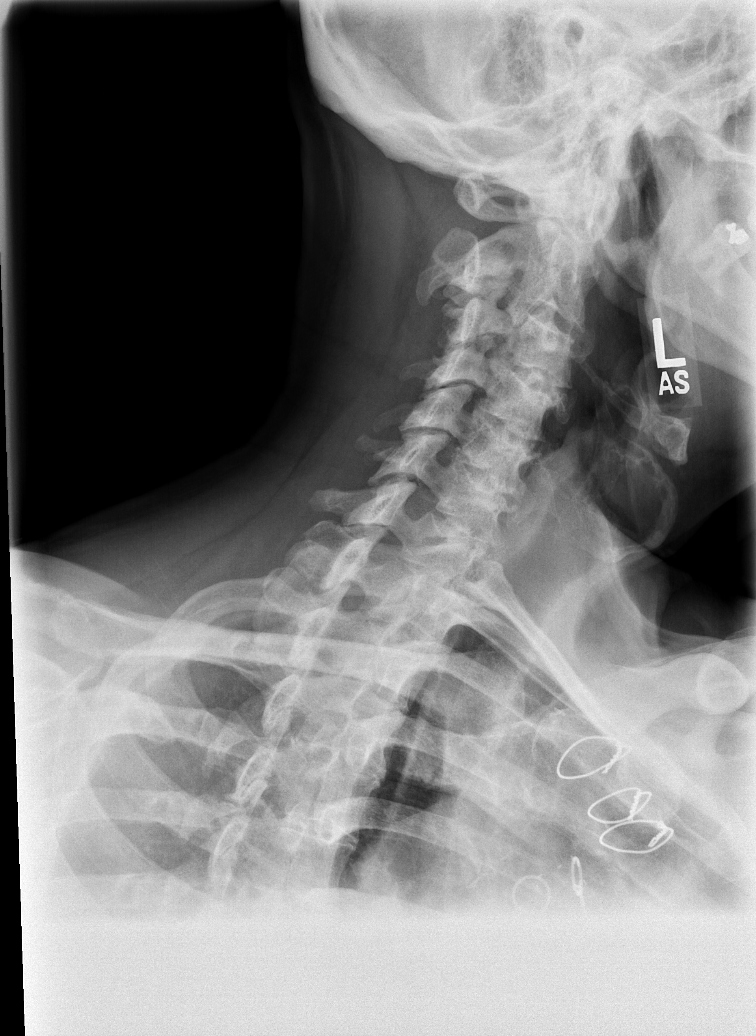

[w c-spine oblique * (2 of 2)]
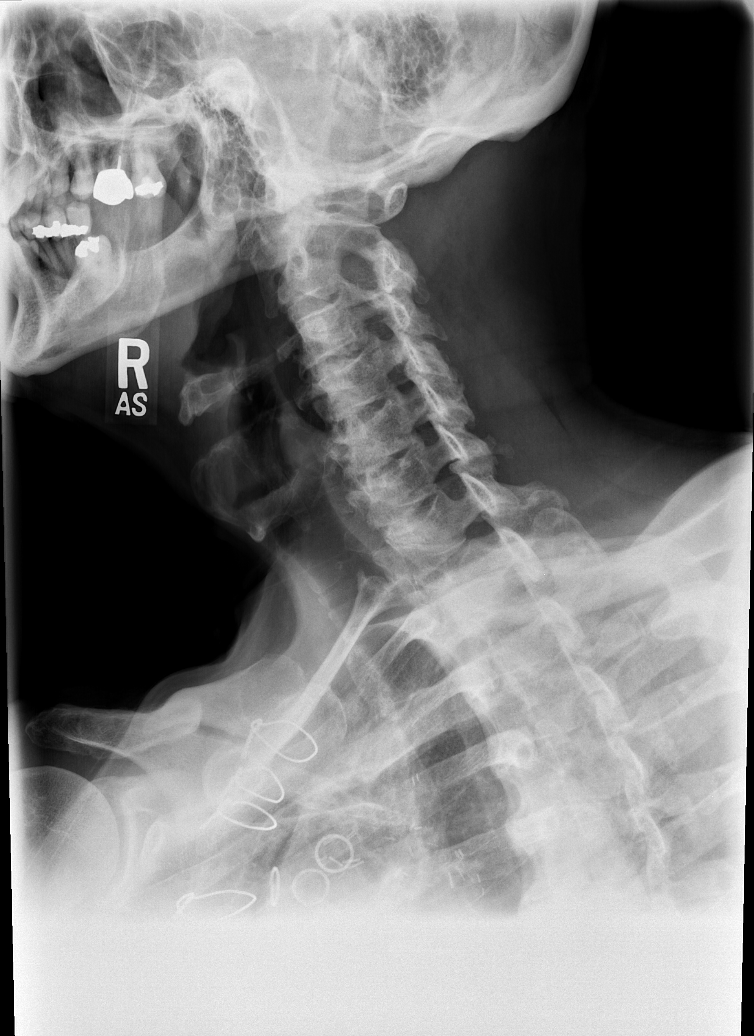

[w c-spine a.p.]
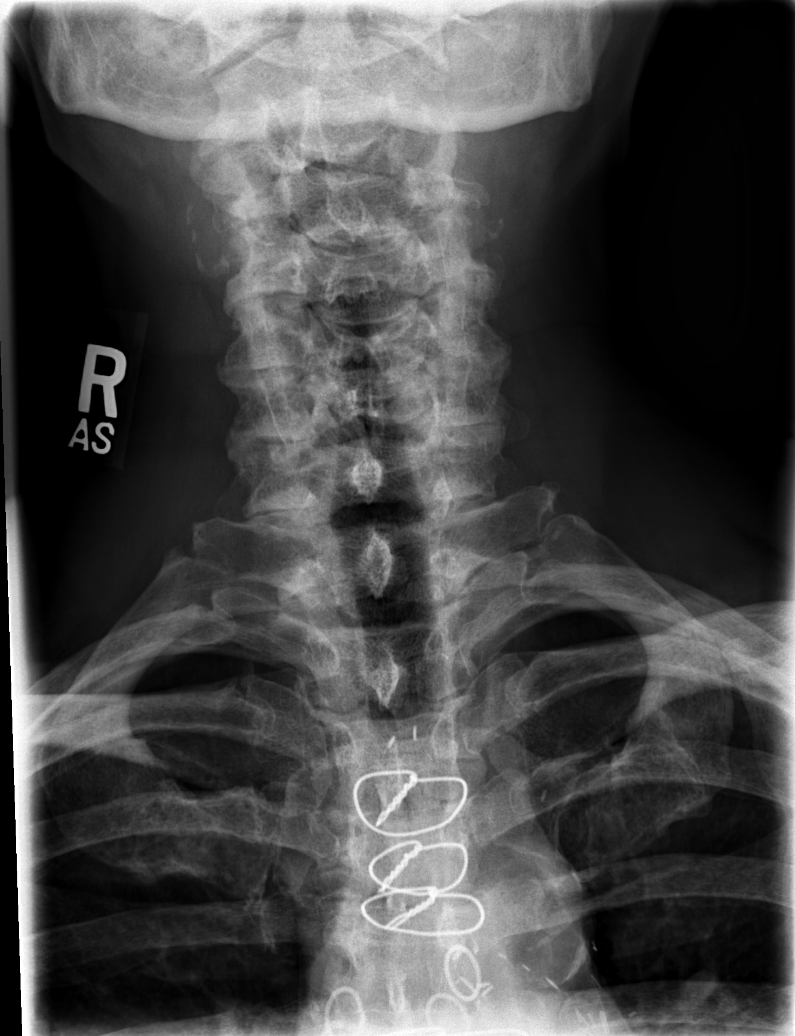

[w c-spine odontoid]
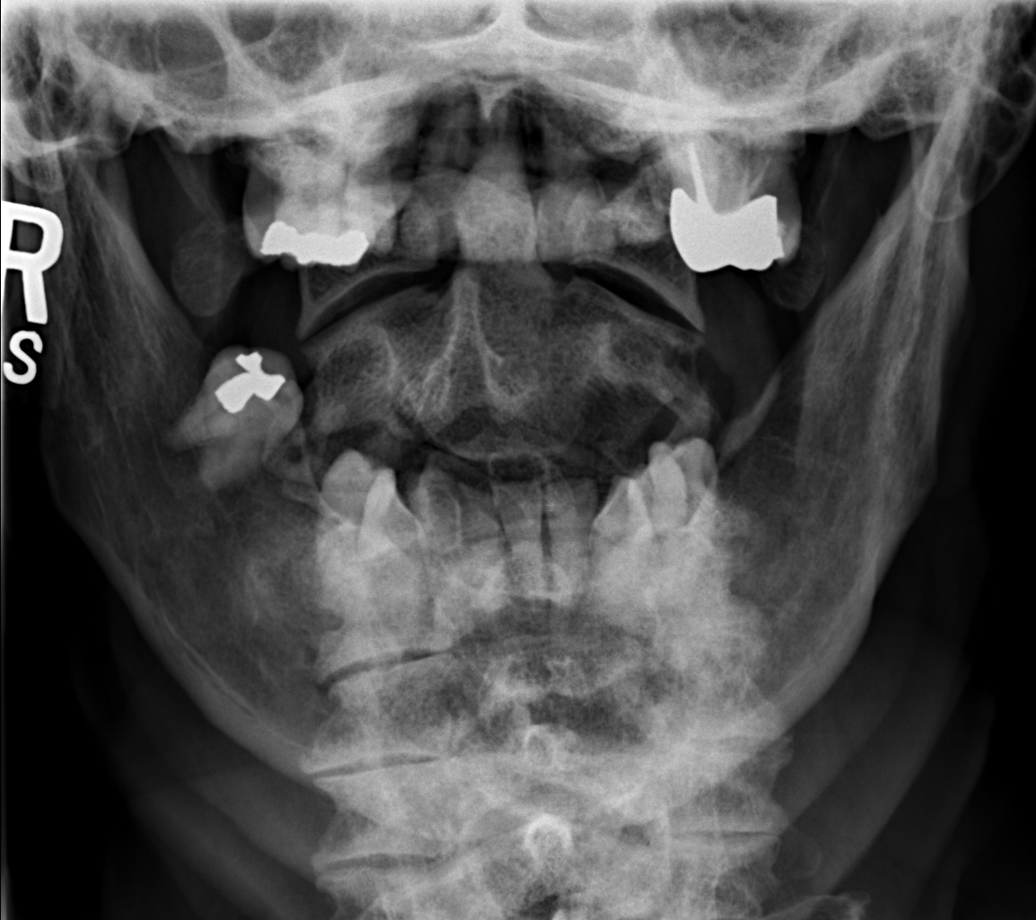

[w swimmers view]
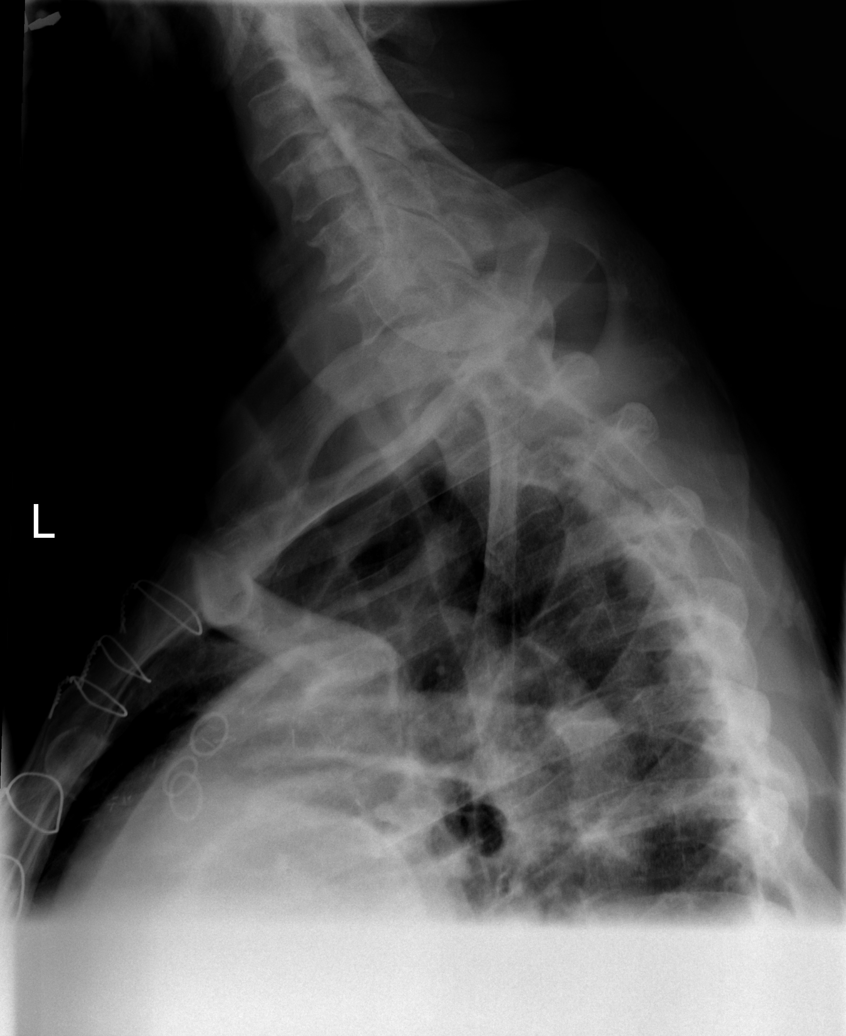

[6 of 6 positions shown; findings below may reference images not displayed]

FINDINGS: The cervical vertebrae are in normal alignment.  There is
mild degenerative disc disease at C4-5 and C5-6 with some loss of
disc space and spurring.  No prevertebral soft tissue swelling is
noted.  On oblique views there is mild foraminal narrowing at C5-6.
The odontoid process is intact.  The lung apices are clear.  Median
sternotomy sutures are noted.
IMPRESSION: Normal alignment with mild degenerative disc disease particularly
at C5-6.

## 2014-08-09 ENCOUNTER — Ambulatory Visit (INDEPENDENT_AMBULATORY_CARE_PROVIDER_SITE_OTHER): Payer: Medicare HMO | Admitting: Interventional Cardiology

## 2014-08-09 ENCOUNTER — Encounter: Payer: Self-pay | Admitting: Interventional Cardiology

## 2014-08-09 VITALS — BP 134/76 | HR 58 | Ht 68.0 in | Wt 232.0 lb

## 2014-08-09 DIAGNOSIS — R600 Localized edema: Secondary | ICD-10-CM

## 2014-08-09 DIAGNOSIS — I25810 Atherosclerosis of coronary artery bypass graft(s) without angina pectoris: Secondary | ICD-10-CM

## 2014-08-09 DIAGNOSIS — R609 Edema, unspecified: Secondary | ICD-10-CM

## 2014-08-09 DIAGNOSIS — I5032 Chronic diastolic (congestive) heart failure: Secondary | ICD-10-CM

## 2014-08-09 DIAGNOSIS — I10 Essential (primary) hypertension: Secondary | ICD-10-CM

## 2014-08-09 LAB — BASIC METABOLIC PANEL
BUN: 24 mg/dL — ABNORMAL HIGH (ref 6–23)
CO2: 35 mEq/L — ABNORMAL HIGH (ref 19–32)
CREATININE: 1.4 mg/dL — AB (ref 0.50–1.35)
Calcium: 8.9 mg/dL (ref 8.4–10.5)
Chloride: 100 mEq/L (ref 96–112)
Glucose, Bld: 127 mg/dL — ABNORMAL HIGH (ref 70–99)
Potassium: 4 mEq/L (ref 3.5–5.3)
Sodium: 140 mEq/L (ref 135–145)

## 2014-08-09 NOTE — Progress Notes (Signed)
Patient ID: Hayden Wallace, male   DOB: 1946/01/08, 68 y.o.   MRN: [id number]    [address], [address] [place], [place]  [address] Phone: [phone] Fax:  [phone]  Date:  08/09/2014   ID:  Hayden Wallace, DOB 03-May-1946, MRN [id number]  PCP:  No PCP Per Patient   ASSESSMENT:  1. Combined systolic and diastolic heart failure, improving after aggressive diuresis by Dr. Gust Brooms 2. Coronary atherosclerosis, status post bypass surgery, without angina. 3. Chronic kidney disease, stage III 4. Hypertension,   PLAN:  1. Continue furosemide 80 mg twice a day 2. Basic metabolic panel 3. office appointment in 4-6 weeks    SUBJECTIVE: Hayden Wallace is a 68 y.o. male who saw his primary physician because of progressive swelling. He states he we told him he was on a 6 L fluid restriction per day. He misinterpreted our instructions which was 2 L per day. He denies angina. He has lost nearly 30 pounds of fluid in the past 2 weeks. He states he feels dehydrated . I last saw him at the end of February. At that time we have started furosemide 80 mg per day. He was lost to followup. He recently saw Dr. Gust Brooms who has his furosemide to 80 mg twice a day. Over 2 weeks he has lost 30 pounds of third space fluid from his abdomen and legs. Scrotal edema has resolved. He was having orthopnea this is improved. His weight on August 14th was 262 pounds and is 232 pounds today.    Wt Readings from Last 3 Encounters:  08/09/14 232 lb (105.235 kg)  02/02/14 249 lb (112.946 kg)  02/02/14 249 lb (112.946 kg)     Past Medical History  Diagnosis Date  . Coronary artery disease   . Acute MI     x2  . Hypertension   . Diabetes mellitus without complication   . Hyperlipidemia   . Hypercholesteremia   . Allergic rhinitis     Current Outpatient Prescriptions  Medication Sig Dispense Refill  . aspirin EC 81 MG tablet Take 81 mg by mouth daily.      . furosemide (LASIX) 80 MG tablet Take 1 tablet  (80 mg total) by mouth daily.  30 tablet  11  . glipiZIDE (GLUCOTROL XL) 10 MG 24 hr tablet Take 10 mg by mouth 2 (two) times daily. Take 2 tablets twice daily      . ibuprofen (ADVIL,MOTRIN) 200 MG tablet Take 400 mg by mouth daily as needed for pain.      Marland Kitchen JANUVIA 100 MG tablet       . lisinopril (PRINIVIL,ZESTRIL) 40 MG tablet Take 0.5 tablets (20 mg total) by mouth daily.  15 tablet  11  . loperamide (IMODIUM A-D) 2 MG tablet Take 2 mg by mouth 3 (three) times daily as needed for diarrhea or loose stools.      . metoprolol (LOPRESSOR) 50 MG tablet Take 1 tablet (50 mg total) by mouth 2 (two) times daily.  180 tablet  3  . omeprazole (PRILOSEC) 40 MG capsule Take 40 mg by mouth daily.      . potassium chloride SA (K-DUR,KLOR-CON) 20 MEQ tablet Take 1 tablet (20 mEq total) by mouth daily.  30 tablet  11  . simvastatin (ZOCOR) 40 MG tablet Take 0.5 tablets (20 mg total) by mouth every evening.  45 tablet  3   No current facility-administered medications for this visit.    Allergies:   No Known  Allergies  Social History:  The patient  reports that he has never smoked. He does not have any smokeless tobacco history on file. He reports that he does not drink alcohol or use illicit drugs.   ROS:  Please see the history of present illness.   Denies cramping and orthostatic dizziness. Orthopnea is improved. Scrotal edema has resolved.   All other systems reviewed and negative.   OBJECTIVE: VS:  BP 134/76  Pulse 58  Ht  (1.727 m)  Wt 232 lb (105.235 kg)  BMI 35.28 kg/m2 Well nourished, well developed, in no acute distress, chronically ill  HEENT: normal Neck: JVD flat. Carotid bruit absent  Cardiac:  normal S1, S2; RRR; no murmur Lungs:  clear to auscultation bilaterally, no wheezing, rhonchi or rales Abd: soft, nontender, no hepatomegaly Ext: Edema 2+ bilateral from the ankles to the knees. Pulses 2+ and symmetric  Skin: warm and dry Neuro:  CNs 2-12 intact, no focal abnormalities  noted  EKG:  Not repeated     ECHOCARDIOGRAM:  Performed in February 2015 revealed an EF of 40-45%  Signed, Darci Needle III, MD 08/09/2014 4:39 PM

## 2014-08-09 NOTE — Patient Instructions (Addendum)
Your physician recommends that you continue on your current medications as directed. Please refer to the Current Medication list given to you today.  Lab Today: Bmet  Limit your fluid intake to 2 Liters daily  You have a follow up appointment scheduled for 09/07/14 @ 9am

## 2014-08-10 ENCOUNTER — Telehealth: Payer: Self-pay

## 2014-08-10 DIAGNOSIS — I5032 Chronic diastolic (congestive) heart failure: Secondary | ICD-10-CM

## 2014-08-10 NOTE — Telephone Encounter (Signed)
Pt aware of labs and Dr.Smith instructions. Continue diuretics as currently given. Repeat BMET 2 weeks,lab appt sch for 9/9 will send copy of these labs to Dr. Gust Brooms.pt verbalized understanding.

## 2014-08-10 NOTE — Telephone Encounter (Signed)
called to give pt lab results and Dr.Smith instructions. lmtcb 

## 2014-08-10 NOTE — Telephone Encounter (Signed)
Message copied by Jarvis Newcomer on Thu Aug 10, 2014  2:21 PM ------      Message from: Verdis Prime      Created: Thu Aug 10, 2014 11:29 AM       Continue diuretics as currently given. Repeat BMET 2 weeks and send copy of these labs to Dr. Gust Brooms. ------

## 2014-08-23 ENCOUNTER — Other Ambulatory Visit (INDEPENDENT_AMBULATORY_CARE_PROVIDER_SITE_OTHER): Payer: Medicare HMO

## 2014-08-23 DIAGNOSIS — I5032 Chronic diastolic (congestive) heart failure: Secondary | ICD-10-CM

## 2014-08-23 LAB — BASIC METABOLIC PANEL
BUN: 28 mg/dL — ABNORMAL HIGH (ref 6–23)
CHLORIDE: 105 meq/L (ref 96–112)
CO2: 29 mEq/L (ref 19–32)
Calcium: 8.4 mg/dL (ref 8.4–10.5)
Creatinine, Ser: 1.4 mg/dL (ref 0.4–1.5)
GFR: 52.84 mL/min — ABNORMAL LOW (ref >60.00)
Glucose, Bld: 158 mg/dL — ABNORMAL HIGH (ref 70–99)
POTASSIUM: 3.7 meq/L (ref 3.5–5.1)
SODIUM: 139 meq/L (ref 135–145)

## 2014-08-30 ENCOUNTER — Telehealth: Payer: Self-pay

## 2014-08-30 DIAGNOSIS — I5032 Chronic diastolic (congestive) heart failure: Secondary | ICD-10-CM

## 2014-08-30 MED ORDER — FUROSEMIDE 80 MG PO TABS: 80.0000 mg | ORAL_TABLET | Freq: Two times a day (BID) | ORAL | Status: DC

## 2014-08-30 NOTE — Telephone Encounter (Signed)
called to give pt lab results.lmom.  Labs are very stable. Continue current medical regimen that includes furosemide twice daily

## 2014-08-30 NOTE — Telephone Encounter (Signed)
Message copied by Jarvis Newcomer on Wed Aug 30, 2014 11:46 AM ------      Message from: Verdis Prime      Created: Thu Aug 24, 2014  8:40 AM       Labs are very stable. Continue current medical regimen that includes furosemide twice daily ------

## 2014-09-07 ENCOUNTER — Encounter: Payer: Self-pay | Admitting: Interventional Cardiology

## 2014-09-07 ENCOUNTER — Ambulatory Visit (INDEPENDENT_AMBULATORY_CARE_PROVIDER_SITE_OTHER): Payer: Medicare HMO | Admitting: Interventional Cardiology

## 2014-09-07 VITALS — BP 160/78 | HR 73 | Ht 67.0 in | Wt 242.0 lb

## 2014-09-07 DIAGNOSIS — I25810 Atherosclerosis of coronary artery bypass graft(s) without angina pectoris: Secondary | ICD-10-CM

## 2014-09-07 DIAGNOSIS — I10 Essential (primary) hypertension: Secondary | ICD-10-CM

## 2014-09-07 DIAGNOSIS — I5032 Chronic diastolic (congestive) heart failure: Secondary | ICD-10-CM

## 2014-09-07 LAB — BASIC METABOLIC PANEL
BUN: 30 mg/dL — ABNORMAL HIGH (ref 6–23)
CO2: 30 mEq/L (ref 19–32)
Calcium: 8.5 mg/dL (ref 8.4–10.5)
Chloride: 102 mEq/L (ref 96–112)
Creatinine, Ser: 1.5 mg/dL (ref 0.4–1.5)
GFR: 49.6 mL/min — AB (ref >60.00)
GLUCOSE: 217 mg/dL — AB (ref 70–99)
Potassium: 3.8 mEq/L (ref 3.5–5.1)
SODIUM: 137 meq/L (ref 135–145)

## 2014-09-07 NOTE — Patient Instructions (Signed)
Your physician recommends that you continue on your current medications as directed. Please refer to the Current Medication list given to you today.  Lab Today: Bmet  Limit your fluid intake to 55oz daily  Your physician wants you to follow-up in:  4 months with Dr.Smith You will receive a reminder letter in the mail two months in advance. If you don't receive a letter, please call our office to schedule the follow-up appointment.

## 2014-09-07 NOTE — Progress Notes (Signed)
Patient ID: Hayden Wallace, male   DOB: 1946/02/24, 68 y.o.   MRN: [id number]    [address] [place], [place]  [address] Phone: [phone] Fax:  [phone]  Date:  09/07/2014   ID:  Hayden Wallace, DOB Jun 22, 1946, MRN [id number]  PCP:  Orland Penman, MD   ASSESSMENT:  1. 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 Significant lower extremity swelling continues 2. Coronary artery disease without symptoms 3. Hypertension, with poor control today.  PLAN:  1. Significantly increase fluid restriction to 1500 cc per day (approximately 55 ounces per day) 2. Basic metabolic panel today 3. Followup in 4 months. Earlier if continued swelling 4. Call if angina   SUBJECTIVE: Hayden Wallace is a 68 y.o. male who has been under stress and not controlling his fluid intake over the past month. His wife was diagnosed with stage II breast cancer. He's been eating at restaurants and has not been restrictive with reference to solve our fluid. He denies angina. There is no orthopnea. There has been a 10 pound weight gain since the last saw him.   Wt Readings from Last 3 Encounters:  09/07/14 242 lb (109.77 kg)  08/09/14 232 lb (105.235 kg)  02/02/14 249 lb (112.946 kg)     Past Medical History  Diagnosis Date  . Coronary artery disease   . Acute MI     x2  . Hypertension   . Diabetes mellitus without complication   . Hyperlipidemia   . Hypercholesteremia   . Allergic rhinitis     Current Outpatient Prescriptions  Medication Sig Dispense Refill  . aspirin EC 81 MG tablet Take 81 mg by mouth daily.      . furosemide (LASIX) 80 MG tablet Take 1 tablet (80 mg total) by mouth 2 (two) times daily.      Marland Kitchen glipiZIDE (GLUCOTROL XL) 10 MG 24 hr tablet Take 10 mg by mouth 2 (two) times daily. Take 2 tablets twice daily      . ibuprofen (ADVIL,MOTRIN) 200 MG tablet Take 400 mg by mouth daily as needed for pain.      Marland Kitchen JANUVIA 100 MG tablet       .  lisinopril (PRINIVIL,ZESTRIL) 40 MG tablet Take 0.5 tablets (20 mg total) by mouth daily.  15 tablet  11  . loperamide (IMODIUM A-D) 2 MG tablet Take 2 mg by mouth 3 (three) times daily as needed for diarrhea or loose stools.      . metoprolol (LOPRESSOR) 50 MG tablet Take 1 tablet (50 mg total) by mouth 2 (two) times daily.  180 tablet  3  . omeprazole (PRILOSEC) 40 MG capsule Take 40 mg by mouth daily.      . potassium chloride SA (K-DUR,KLOR-CON) 20 MEQ tablet Take 1 tablet (20 mEq total) by mouth daily.  30 tablet  11  . simvastatin (ZOCOR) 40 MG tablet Take 0.5 tablets (20 mg total) by mouth every evening.  45 tablet  3   No current facility-administered medications for this visit.    Allergies:   No Known Allergies  Social History:  The patient  reports that he has never smoked. He does not have any smokeless tobacco history on file. He reports that he does not drink alcohol or use illicit drugs.   ROS:  Please see the history of present illness.   No syncope, orthostatic dizziness, or other complaints.   All other systems reviewed and negative.   OBJECTIVE: VS:  BP  160/78  Pulse 73  Ht  (1.702 m)  Wt 242 lb (109.77 kg)  BMI 37.89 kg/m2  SpO2 97% Well nourished, well developed, in no acute distress, chronically ill-appearing HEENT: normal Neck: JVD elevated while sitting. Carotid bruit absent  Cardiac:  normal S1, S2; RRR; no murmur Lungs:  clear to auscultation bilaterally, no wheezing, rhonchi or rales Abd: soft, nontender, no hepatomegaly Ext: Edema 2+ to 3+ bilateral from ankles to mid shin. Pulses 2+ Skin: warm and dry Neuro:  CNs 2-12 intact, no focal abnormalities noted  EKG:  Not performed       Signed, Darci Needle III, MD 09/07/2014 9:05 AM

## 2014-09-08 ENCOUNTER — Telehealth: Payer: Self-pay

## 2014-09-08 NOTE — Telephone Encounter (Signed)
Message copied by Jarvis Newcomer on Fri Sep 08, 2014  4:47 PM ------      Message from: Verdis Prime      Created: Thu Sep 07, 2014  6:35 PM       Stable labs ------

## 2014-09-08 NOTE — Telephone Encounter (Signed)
lmom.  Stable labs

## 2014-12-21 ENCOUNTER — Other Ambulatory Visit: Payer: Self-pay | Admitting: *Deleted

## 2014-12-21 DIAGNOSIS — I5032 Chronic diastolic (congestive) heart failure: Secondary | ICD-10-CM

## 2014-12-21 MED ORDER — SIMVASTATIN 40 MG PO TABS: 20.0000 mg | ORAL_TABLET | Freq: Every evening | ORAL | Status: DC

## 2014-12-21 MED ORDER — POTASSIUM CHLORIDE CRYS ER 20 MEQ PO TBCR: 20.0000 meq | EXTENDED_RELEASE_TABLET | Freq: Every day | ORAL | Status: DC

## 2014-12-21 MED ORDER — METOPROLOL TARTRATE 50 MG PO TABS: 50.0000 mg | ORAL_TABLET | Freq: Two times a day (BID) | ORAL | Status: DC

## 2014-12-25 ENCOUNTER — Other Ambulatory Visit: Payer: Self-pay

## 2014-12-25 DIAGNOSIS — I5032 Chronic diastolic (congestive) heart failure: Secondary | ICD-10-CM

## 2014-12-25 MED ORDER — SIMVASTATIN 40 MG PO TABS: 20.0000 mg | ORAL_TABLET | Freq: Every evening | ORAL | Status: DC

## 2014-12-25 MED ORDER — METOPROLOL TARTRATE 50 MG PO TABS: 50.0000 mg | ORAL_TABLET | Freq: Two times a day (BID) | ORAL | Status: DC

## 2014-12-25 MED ORDER — POTASSIUM CHLORIDE CRYS ER 20 MEQ PO TBCR: 20.0000 meq | EXTENDED_RELEASE_TABLET | Freq: Every day | ORAL | Status: DC

## 2015-01-02 ENCOUNTER — Telehealth: Payer: Self-pay | Admitting: Interventional Cardiology

## 2015-01-02 DIAGNOSIS — E785 Hyperlipidemia, unspecified: Secondary | ICD-10-CM

## 2015-01-02 NOTE — Telephone Encounter (Signed)
New Msg        Pt would [facility] to [facility]. Pt would like to know if it is ok for him to join?   Does he need stress test? Pt wants to stop retaining water and get active and wants to be seen before March.  Please return pt call.

## 2015-01-02 NOTE — Telephone Encounter (Signed)
Returned pt call.pt would like a ref to cardiac rehab. He sts that he is deconditioned and would like to start an exercise program to help him build his exercise tolerance. He is still having onging issue with Le edema. He is taking his diuretic Lasix 80mg  bid as prescribed. He is tries to adhere to the 1500cc fluid restriction Dr.Smith recommended. He denies sob, he is fatigued due to his fluid retention. He would like to be seen earleir then March. appt moved up to 2/5 @ 8:15am. He has questions for Dr.Smith, adv him he is currently out of the office. I will fwd him a message and call back with his recommendations  1) would he benefit form using sauna? 2) can we initiate a ref to [facility] cardiac rehab 3) does need labs prior to his o/v on 2/5

## 2015-01-03 NOTE — Telephone Encounter (Signed)
He needs to wear moderate tension support stockings daily.  He should not do sauna.  Okay to see if we can qualify him for rehab under CHF diagnosis.

## 2015-01-10 NOTE — Telephone Encounter (Signed)
called to give pt Dr.Smith's recommendations. lmtcb 

## 2015-01-10 NOTE — Telephone Encounter (Signed)
Pt aware of Dr.Smith's recommendation. He needs to wear moderate tension support stockings daily. He should not do sauna. Okay to see if we can qualify him for rehab under CHF diagnosis. Pt request a written Rx for compression stockings. Adv him that it will be left at the front desk for pick up. Adv him that the ref to [facility] has been signed by Dr.Smith and faxed. They [facility] will contact him usually with in 1 wk. He will come to the office on 01/18/15 for fasting labs. Pt agreeable with plan and verbalized understanding.

## 2015-01-18 ENCOUNTER — Other Ambulatory Visit (INDEPENDENT_AMBULATORY_CARE_PROVIDER_SITE_OTHER): Payer: Commercial Managed Care - HMO | Admitting: *Deleted

## 2015-01-18 DIAGNOSIS — E785 Hyperlipidemia, unspecified: Secondary | ICD-10-CM

## 2015-01-18 DIAGNOSIS — I5032 Chronic diastolic (congestive) heart failure: Secondary | ICD-10-CM

## 2015-01-18 LAB — BASIC METABOLIC PANEL
BUN: 41 mg/dL — ABNORMAL HIGH (ref 6–23)
CALCIUM: 8.3 mg/dL — AB (ref 8.4–10.5)
CHLORIDE: 109 meq/L (ref 96–112)
CO2: 28 mEq/L (ref 19–32)
CREATININE: 1.59 mg/dL — AB (ref 0.40–1.50)
GFR: 46.32 mL/min — ABNORMAL LOW (ref >60.00)
GLUCOSE: 93 mg/dL (ref 70–99)
Potassium: 4.6 mEq/L (ref 3.5–5.1)
Sodium: 143 mEq/L (ref 135–145)

## 2015-01-18 LAB — LIPID PANEL
Cholesterol: 106 mg/dL (ref 0–200)
HDL: 27.3 mg/dL — ABNORMAL LOW (ref >39.00)
LDL Cholesterol: 57 mg/dL (ref 0–99)
NonHDL: 78.7
Total CHOL/HDL Ratio: 4
Triglycerides: 108 mg/dL (ref 0.0–149.0)
VLDL: 21.6 mg/dL (ref 0.0–40.0)

## 2015-01-18 LAB — ALT: ALT: 14 U/L (ref 0–53)

## 2015-01-18 NOTE — Addendum Note (Signed)
Addended by: Tonita PhoenixBOWDEN, Gem Conkle K on: 01/18/2015 09:02 AM   Modules accepted: Orders

## 2015-01-19 ENCOUNTER — Ambulatory Visit (INDEPENDENT_AMBULATORY_CARE_PROVIDER_SITE_OTHER): Payer: Commercial Managed Care - HMO | Admitting: Interventional Cardiology

## 2015-01-19 ENCOUNTER — Encounter: Payer: Self-pay | Admitting: Interventional Cardiology

## 2015-01-19 VITALS — BP 126/74 | HR 73 | Ht 67.0 in | Wt 266.2 lb

## 2015-01-19 DIAGNOSIS — I10 Essential (primary) hypertension: Secondary | ICD-10-CM

## 2015-01-19 DIAGNOSIS — I5043 Acute on chronic combined systolic (congestive) and diastolic (congestive) heart failure: Secondary | ICD-10-CM

## 2015-01-19 DIAGNOSIS — R0600 Dyspnea, unspecified: Secondary | ICD-10-CM

## 2015-01-19 DIAGNOSIS — I25810 Atherosclerosis of coronary artery bypass graft(s) without angina pectoris: Secondary | ICD-10-CM

## 2015-01-19 MED ORDER — FUROSEMIDE 80 MG PO TABS: 120.0000 mg | ORAL_TABLET | Freq: Every day | ORAL | Status: DC

## 2015-01-19 MED ORDER — METOLAZONE 5 MG PO TABS: ORAL_TABLET | ORAL | Status: DC

## 2015-01-19 MED ORDER — POTASSIUM CHLORIDE CRYS ER 20 MEQ PO TBCR: 20.0000 meq | EXTENDED_RELEASE_TABLET | Freq: Two times a day (BID) | ORAL | Status: DC

## 2015-01-19 NOTE — Patient Instructions (Addendum)
Your physician has recommended you make the following change in your medication:  1) HOLD Lisinopril until further instructions 2) INCREASE Lasix to 120mg  each morning 3) START Metolazone 5mg . Take 30 minutes before Lasix for the next 4 days. 4) INCREASE Potassium to Twice daily  Your physician recommends that you return for lab work on Monday 01/22/14 (Tsh, Bmet,Bnp)  Once labs are reviewed on Monday we will call you with further instructions. If your symptoms are not improving please go to the Emergency Dept.

## 2015-01-19 NOTE — Progress Notes (Addendum)
Cardiology Office Note   Date:  01/19/2015   ID:  Hayden Patellalbert Heffron, DOB 03/23/1946, MRN [id number]  PCP:  Orland PenmanJARALLA SHAMLEFFER, IBTEHAL, MD  Cardiologist:   Lesleigh NoeSMITH III,Ariana Cavenaugh W, MD   No chief complaint on file.     History of Present Illness: Hayden Wallace is a 69 y.o. male who presents for evaluation and follow-up of chronic combined systolic and diastolic heart failure. He is developed progressive fluid retention, dyspnea, and fatigue. Though we have attempted aggressive diuresis weight gain has continued to be an issue. He is up 30 pounds since August despite the addition of 80 mg twice a day of furosemide. He denies angina. I do believe he is compliant with his medication although he has not had any medication yet today. The BUN and creatinine yesterday were orally and 1.59.    Past Medical History  Diagnosis Date  . Coronary artery disease   . Acute MI     x2  . Hypertension   . Diabetes mellitus without complication   . Hyperlipidemia   . Hypercholesteremia   . Allergic rhinitis     Past Surgical History  Procedure Laterality Date  . Coronary artery bypass graft      multi-vessel 2004  . Coronary angioplasty with stent placement       Current Outpatient Prescriptions  Medication Sig Dispense Refill  . aspirin EC 81 MG tablet Take 81 mg by mouth daily.    . furosemide (LASIX) 80 MG tablet Take 1 tablet (80 mg total) by mouth 2 (two) times daily.    Marland Kitchen. glipiZIDE (GLUCOTROL XL) 10 MG 24 hr tablet Take 10 mg by mouth 2 (two) times daily. Take 2 tablets twice daily    . JANUVIA 100 MG tablet Take 100 mg by mouth daily.     Marland Kitchen. lisinopril (PRINIVIL,ZESTRIL) 40 MG tablet Take 0.5 tablets (20 mg total) by mouth daily. 15 tablet 11  . metoprolol (LOPRESSOR) 50 MG tablet Take 1 tablet (50 mg total) by mouth 2 (two) times daily. 180 tablet 0  . potassium chloride SA (K-DUR,KLOR-CON) 20 MEQ tablet Take 1 tablet (20 mEq total) by mouth daily. 90 tablet 0  . simvastatin (ZOCOR) 40  MG tablet Take 0.5 tablets (20 mg total) by mouth every evening. 45 tablet 0   No current facility-administered medications for this visit.    Allergies:   Review of patient's allergies indicates no known allergies.    Social History:  The patient  reports that he has never smoked. He does not have any smokeless tobacco history on file. He reports that he does not drink alcohol or use illicit drugs.   Family History:  The patient's family history includes Heart disease in his father and mother.    ROS:  Please see the history of present illness.   Otherwise, review of systems are positive for fluid restriction, fatigue, lightheadedness, but no palpitations..   All other systems are reviewed and negative.    PHYSICAL EXAM: VS:  BP 126/74 mmHg  Pulse 73  Ht 5\' 7"  (1.702 m)  Wt 266 lb 3.2 oz (120.748 kg)  BMI 41.68 kg/m2 , BMI Body mass index is 41.68 kg/(m^2). GEN: Well nourished, well developed, in no acute distress HEENT: normal Neck: no JVD, carotid bruits, or masses Cardiac: RRR; no murmurs, rubs, or gallops,no edema  Respiratory:  clear to auscultation bilaterally, normal work of breathing GI: soft, nontender, nondistended, + BS MS: no deformity or atrophy Skin: warm and dry, no rash  Neuro:  Strength and sensation are intact Psych: euthymic mood, full affect   EKG:  EKG is ordered today. The ekg ordered today demonstrates sinus rhythm with poor R-wave progression. There is mild left axis deviation.   Recent Labs: 01/19/2014: Pro B Natriuretic peptide (BNP) 341.0* 02/02/2014: TSH 3.23 01/18/2015: ALT 14; BUN 41*; Creatinine 1.59*; Potassium 4.6; Sodium 143    Lipid Panel    Component Value Date/Time   CHOL 106 01/18/2015 0902   TRIG 108.0 01/18/2015 0902   HDL 27.30* 01/18/2015 0902   CHOLHDL 4 01/18/2015 0902   VLDL 21.6 01/18/2015 0902   LDLCALC 57 01/18/2015 0902      Wt Readings from Last 3 Encounters:  01/19/15 266 lb 3.2 oz (120.748 kg)  09/07/14 242 lb  (109.77 kg)  08/09/14 232 lb (105.235 kg)      Other studies Reviewed: Additional studies/ records that were reviewed today include: February 2015 echocardiogram. Laboratory work from February 2016.  Review of the above records demonstrates:  Left ventricular ejection fraction 40%   ASSESSMENT AND PLAN:  1.  Acute on chronic combined systolic and diastolic heart failure with marked fluid overload. 30 pound weight gain since September despite 80 mg of furosemide twice daily. We will more aggressively diuresed by adding metolazone to his loop diuretic regimen. I will hold lisinopril to guard against deterioration in renal function. If after 48-72 hours there is not significant diuresis, he will be admitted to the hospital either by [place] or he could simply come to the emergency room. He understands this approach. We will otherwise plan on seeing the patient on Monday for lab work and further adjustment in the diuretic regimen based upon response to the above therapeutic measures. 2. Hypertension without significant elevation 3. Coronary artery disease without evidence of angina pectoris 4. Dyspnea likely related to heart failure. Rule out other   Current medicines are reviewed at length with the patient today.  The patient does not have concerns regarding medicines.  The following changes have been made:  Hold lisinopril; start metolazone 5 mg daily for the next 4 days; change furosemide to a single daily dose of 120 mg each morning 30-45 minutes after metolazone has been taken; increase potassium to 20 mEq twice a day.  Labs/ tests ordered today include:  In 4 days, TSH, BNP, basic metabolic panel No orders of the defined types were placed in this encounter.     Disposition:   FU with Mendel Ryder in 4 days   Signed, Lesleigh Noe, MD  01/19/2015 8:22 AM    [facility] [address] [place], [place], [place]  [address] Phone: [phone]; Fax: [phone]

## 2015-01-22 ENCOUNTER — Telehealth: Payer: Self-pay

## 2015-01-22 ENCOUNTER — Other Ambulatory Visit (INDEPENDENT_AMBULATORY_CARE_PROVIDER_SITE_OTHER): Payer: Commercial Managed Care - HMO | Admitting: *Deleted

## 2015-01-22 DIAGNOSIS — I5043 Acute on chronic combined systolic (congestive) and diastolic (congestive) heart failure: Secondary | ICD-10-CM

## 2015-01-22 DIAGNOSIS — R0600 Dyspnea, unspecified: Secondary | ICD-10-CM

## 2015-01-22 LAB — BASIC METABOLIC PANEL
BUN: 48 mg/dL — AB (ref 6–23)
CALCIUM: 8.3 mg/dL — AB (ref 8.4–10.5)
CHLORIDE: 106 meq/L (ref 96–112)
CO2: 28 meq/L (ref 19–32)
CREATININE: 1.66 mg/dL — AB (ref 0.40–1.50)
GFR: 44.07 mL/min — AB (ref >60.00)
GLUCOSE: 150 mg/dL — AB (ref 70–99)
Potassium: 4.9 mEq/L (ref 3.5–5.1)
SODIUM: 141 meq/L (ref 135–145)

## 2015-01-22 LAB — TSH: TSH: 3.83 u[IU]/mL (ref 0.35–4.50)

## 2015-01-22 LAB — BRAIN NATRIURETIC PEPTIDE: PRO B NATRI PEPTIDE: 1941 pg/mL — AB (ref 0.0–100.0)

## 2015-01-22 NOTE — Telephone Encounter (Signed)
-----   Message from Lesleigh NoeHenry W Smith III, MD sent at 01/22/2015  2:15 PM EST ----- Continue metolazone and furosemide tomorrow and Tuesday. I need to see this week.

## 2015-01-22 NOTE — Telephone Encounter (Signed)
called to give pt lab results and Dr.Smith's recommendation.lmtcb 

## 2015-01-22 NOTE — Telephone Encounter (Signed)
-----   Message from Henry W Smith III, MD sent at 01/22/2015  2:15 PM EST ----- Continue metolazone and furosemide tomorrow and Tuesday. I need to see this week. 

## 2015-01-22 NOTE — Telephone Encounter (Signed)
pt aware of Dr.Smith's recommendation.Continue metolazone and furosemide today and Tuesday.pt needs to be seen this week. f/u appt scheduled on 2/11.@8am .he reports that he has had good urine output, improvement in sob and swelling.pt adv to call the office if things take a turn and symptoms worsen.pt verbalized understanding.

## 2015-01-25 ENCOUNTER — Encounter: Payer: Self-pay | Admitting: Interventional Cardiology

## 2015-01-25 ENCOUNTER — Ambulatory Visit (INDEPENDENT_AMBULATORY_CARE_PROVIDER_SITE_OTHER): Payer: Commercial Managed Care - HMO | Admitting: Interventional Cardiology

## 2015-01-25 ENCOUNTER — Telehealth: Payer: Self-pay | Admitting: Interventional Cardiology

## 2015-01-25 VITALS — BP 120/68 | HR 83 | Ht 68.0 in | Wt 247.4 lb

## 2015-01-25 DIAGNOSIS — E785 Hyperlipidemia, unspecified: Secondary | ICD-10-CM

## 2015-01-25 DIAGNOSIS — I10 Essential (primary) hypertension: Secondary | ICD-10-CM

## 2015-01-25 DIAGNOSIS — I5043 Acute on chronic combined systolic (congestive) and diastolic (congestive) heart failure: Secondary | ICD-10-CM

## 2015-01-25 DIAGNOSIS — I25810 Atherosclerosis of coronary artery bypass graft(s) without angina pectoris: Secondary | ICD-10-CM

## 2015-01-25 DIAGNOSIS — R600 Localized edema: Secondary | ICD-10-CM

## 2015-01-25 LAB — BASIC METABOLIC PANEL
BUN: 50 mg/dL — AB (ref 6–23)
CALCIUM: 8.3 mg/dL — AB (ref 8.4–10.5)
CHLORIDE: 104 meq/L (ref 96–112)
CO2: 33 mEq/L — ABNORMAL HIGH (ref 19–32)
Creatinine, Ser: 1.68 mg/dL — ABNORMAL HIGH (ref 0.40–1.50)
GFR: 43.47 mL/min — ABNORMAL LOW (ref >60.00)
Glucose, Bld: 71 mg/dL (ref 70–99)
Potassium: 4.7 mEq/L (ref 3.5–5.1)
Sodium: 140 mEq/L (ref 135–145)

## 2015-01-25 MED ORDER — METOLAZONE 2.5 MG PO TABS: ORAL_TABLET | ORAL | Status: DC

## 2015-01-25 NOTE — Patient Instructions (Addendum)
Your physician has recommended you make the following change in your medication:  1) Start metolazone 2.5 mg one tablet by mouth once daily- take 30 minutes prior to morning lasix (furosemide) 2) Increase lasix (furosemide) to 80 mg 1 & 1/2 tablets (120 mg total) by mouth every morning  Your physician recommends that you have lab work today and at your next appointment with Dr. Katrinka BlazingSmith: [facility]  Your physician recommends that you schedule a follow-up appointment in: 7-10 days with Dr. Katrinka BlazingSmith Wednesday 01/31/15 at 2:45 PM

## 2015-01-25 NOTE — Telephone Encounter (Signed)
The patient called back to the office today to clarify his medications from earlier today.  He reviewed his bottles with me:  2/5-2/10: 1) metolazone 2.5 mg two tablets 30 minutes prior to lasix 2) lasix 80 mg 1 & 1/2 tablets once daily 3) potassium 20 meq two tablets once daily  He reported having instructions at one time for lasix 80 mg - take 2 &1/2 tablets twice daily- it is unclear if he actually took this dose, but he did confirm the above dose of lasix for the last week.   He was instructed today at his office visit to take: 1) metolazone 2.5 mg one tablet 30 minutes prior to lasix (I have confirmed with the pharmacy this is available for pick up and the patient is aware). 2) take lasix 80 mg 1 & 1/2 tablets once daily - no additional change was made to his potassium- his medication list is still reading 20 meq one BID  BMP today showed K+/ BUN/ creatinine- 4.7/ 50/ 1.68, which is stable for the patient over the last week.   I have encouraged the patient to weigh at home and how to do this appropriately. He has a scale, but states he has trouble seeing the numbers. He will attempt to weigh. I have advised him that I will review his medications with Dr. Katrinka BlazingSmith and we will back with him tomorrow to confirm if there is any change to his medications or if they will remain the same until he is see on 01/31/15. He is agreeable.

## 2015-01-25 NOTE — Progress Notes (Signed)
Patient ID: Hayden Wallace, male   DOB: Apr 22, 1946, 69 y.o.   MRN: [id number]    Cardiology Office Note   Date:  01/25/2015   ID:  Hayden Wallace, DOB 01-Aug-1946, MRN [id number]  PCP:  Orland Penman, MD  Cardiologist:   Lesleigh Noe, MD   No chief complaint on file.     History of Present Illness: Hayden Wallace is a 69 y.o. male who presents for f/u of anasarca. Has been having some diarrhea.    Past Medical History  Diagnosis Date  . Coronary artery disease   . Acute MI     x2  . Hypertension   . Diabetes mellitus without complication   . Hyperlipidemia   . Hypercholesteremia   . Allergic rhinitis     Past Surgical History  Procedure Laterality Date  . Coronary artery bypass graft      multi-vessel 2004  . Coronary angioplasty with stent placement       Current Outpatient Prescriptions  Medication Sig Dispense Refill  . aspirin EC 81 MG tablet Take 81 mg by mouth daily.    . furosemide (LASIX) 80 MG tablet Take 1.5 tablets (120 mg total) by mouth daily. 45 tablet 5  . glipiZIDE (GLUCOTROL XL) 10 MG 24 hr tablet Take 10 mg by mouth 2 (two) times daily. Take 2 tablets twice daily    . JANUVIA 100 MG tablet Take 100 mg by mouth daily.     . metolazone (ZAROXOLYN) 5 MG tablet Take 1 tablet 30 minutes before Lasix each morning as directed 30 tablet 0  . metoprolol (LOPRESSOR) 50 MG tablet Take 1 tablet (50 mg total) by mouth 2 (two) times daily. 180 tablet 0  . potassium chloride SA (K-DUR,KLOR-CON) 20 MEQ tablet Take 1 tablet (20 mEq total) by mouth 2 (two) times daily. 60 tablet 5  . simvastatin (ZOCOR) 40 MG tablet Take 0.5 tablets (20 mg total) by mouth every evening. 45 tablet 0  . lisinopril (PRINIVIL,ZESTRIL) 40 MG tablet Take 0.5 tablets (20 mg total) by mouth daily. (Patient not taking: Reported on 01/25/2015) 15 tablet 11   No current facility-administered medications for this visit.    Allergies:   Review of patient's allergies indicates  no known allergies.    Social History:  The patient  reports that he has never smoked. He does not have any smokeless tobacco history on file. He reports that he does not drink alcohol or use illicit drugs.   Family History:  The patient's family history includes Heart disease in his father and mother.    ROS:  Please see the history of present illness.   Otherwise, review of systems are positive for none.   All other systems are reviewed and negative.    PHYSICAL EXAM: VS:  BP 120/68 mmHg  Pulse 83  Ht  (1.727 m)  Wt 247 lb 6.4 oz (112.22 kg)  BMI 37.63 kg/m2  SpO2 94% , BMI Body mass index is 37.63 kg/(m^2). GEN: Well nourished, well developed, in no acute distress HEENT: normal Neck: no JVD, carotid bruits, or masses Cardiac: RRR; no murmurs, rubs, or gallops,no edema  Respiratory:  clear to auscultation bilaterally, normal work of breathing GI: soft, nontender, nondistended, + BS MS: no deformity or atrophy Skin: warm and dry, no rash Neuro:  Strength and sensation are intact Psych: euthymic mood, full affect   EKG:  EKG is not ordered today. The ekg ordered today demonstrates    Recent Labs: 01/18/2015:  ALT 14 01/22/2015: BUN 48*; Creatinine 1.66*; Potassium 4.9; Pro B Natriuretic peptide (BNP) 1941.0*; Sodium 141; TSH 3.83    Lipid Panel    Component Value Date/Time   CHOL 106 01/18/2015 0902   TRIG 108.0 01/18/2015 0902   HDL 27.30* 01/18/2015 0902   CHOLHDL 4 01/18/2015 0902   VLDL 21.6 01/18/2015 0902   LDLCALC 57 01/18/2015 0902      Wt Readings from Last 3 Encounters:  01/25/15 247 lb 6.4 oz (112.22 kg)  01/19/15 266 lb 3.2 oz (120.748 kg)  09/07/14 242 lb (109.77 kg)      Other studies Reviewed: Additional studies/ records that were reviewed today include: . Review of the above records demonstrates:    ASSESSMENT AND PLAN:  1. Chronic combined systolic and diastolic heart failure with biventricular heart failure/anasarca. Since initiation  of metolazone therapy, he has lost 20 pounds over the past 6 days. We'll plan to decrease metolazone to 2-1/2 mg daily and continue furosemide 120 mg daily. Basic metabolic panel will be obtained today. 2. Coronary atherosclerotic heart disease without angina pectoris 3. Hypertension with blood pressure well controlled currently   Current medicines are reviewed at length with the patient today.  The patient has concerns regarding medicines.  The following changes have been made:  He has difficulty purchasing metolazone. He is able to purchase it. We rediscussed taking metolazone but now at a decreased dose of 2.5 mg 30-45 minutes prior to the a.m. furosemide dose, which is 120 mg each morning.  Labs/ tests ordered today include: Basic metabolic panel today.  No orders of the defined types were placed in this encounter.     Disposition:   FU with H smith, 1 week. in 1 week   Signed, Lesleigh NoeSMITH III,HENRY W, MD  01/25/2015 8:33 AM    [facility] [address], [place], [place]  [address] Phone: [phone](336) [phone]; Fax: [phone](336) [phone]

## 2015-01-26 NOTE — Telephone Encounter (Signed)
Reviewed my conversation yesterday with the patient to clarify his meds with Dr. Katrinka BlazingSmith. Per Dr. Katrinka BlazingSmith, the patient should continue his medications as prescribed at yesterdays office visit. I have left a message for the patient to call.

## 2015-01-31 ENCOUNTER — Ambulatory Visit (INDEPENDENT_AMBULATORY_CARE_PROVIDER_SITE_OTHER): Payer: Commercial Managed Care - HMO | Admitting: Interventional Cardiology

## 2015-01-31 ENCOUNTER — Encounter: Payer: Self-pay | Admitting: Interventional Cardiology

## 2015-01-31 VITALS — BP 130/70 | HR 80 | Ht 68.0 in | Wt 241.0 lb

## 2015-01-31 DIAGNOSIS — I255 Ischemic cardiomyopathy: Secondary | ICD-10-CM

## 2015-01-31 DIAGNOSIS — I5043 Acute on chronic combined systolic (congestive) and diastolic (congestive) heart failure: Secondary | ICD-10-CM

## 2015-01-31 DIAGNOSIS — I25810 Atherosclerosis of coronary artery bypass graft(s) without angina pectoris: Secondary | ICD-10-CM

## 2015-01-31 DIAGNOSIS — I10 Essential (primary) hypertension: Secondary | ICD-10-CM

## 2015-01-31 LAB — BASIC METABOLIC PANEL
BUN: 60 mg/dL — AB (ref 6–23)
CO2: 32 meq/L (ref 19–32)
Calcium: 8.3 mg/dL — ABNORMAL LOW (ref 8.4–10.5)
Chloride: 101 mEq/L (ref 96–112)
Creatinine, Ser: 1.97 mg/dL — ABNORMAL HIGH (ref 0.40–1.50)
GFR: 36.17 mL/min — ABNORMAL LOW (ref >60.00)
GLUCOSE: 177 mg/dL — AB (ref 70–99)
Potassium: 4.3 mEq/L (ref 3.5–5.1)
SODIUM: 139 meq/L (ref 135–145)

## 2015-01-31 NOTE — Patient Instructions (Signed)
Your physician recommends that you continue on your current medications as directed. Please refer to the Current Medication list given to you today.  Lab Today: Bmet  You have a follow up appointment scheduled on 02/06/15 @ 9:30am  Your physician recommends that you return for lab work the same day as appt

## 2015-01-31 NOTE — Progress Notes (Signed)
Cardiology Office Note   Date:  01/31/2015   ID:  Hayden Wallace, DOB 06-15-1946, MRN [id number]  PCP:  Orland Penman, MD  Cardiologist:   Lesleigh Noe, MD   No chief complaint on file.     History of Present Illness: Hayden Wallace is a 69 y.o. male who presents for follow-up of anasarca. He is not been taking medications as prescribed. He has not yet had diuretics today. He is still on 7 pounds since one week ago.    Past Medical History  Diagnosis Date  . Coronary artery disease   . Acute MI     x2  . Hypertension   . Diabetes mellitus without complication   . Hyperlipidemia   . Hypercholesteremia   . Allergic rhinitis     Past Surgical History  Procedure Laterality Date  . Coronary artery bypass graft      multi-vessel 2004  . Coronary angioplasty with stent placement       Current Outpatient Prescriptions  Medication Sig Dispense Refill  . aspirin EC 81 MG tablet Take 81 mg by mouth daily.    . furosemide (LASIX) 80 MG tablet Take 1.5 tablets (120 mg total) by mouth daily. 45 tablet 5  . glipiZIDE (GLUCOTROL XL) 10 MG 24 hr tablet Take 10 mg by mouth 2 (two) times daily. Take 2 tablets twice daily    . JANUVIA 100 MG tablet Take 100 mg by mouth daily.     . metolazone (ZAROXOLYN) 2.5 MG tablet Take one tablet by mouth once daily 30 minutes prior to lasix (furosemide) 20 tablet 3  . metoprolol (LOPRESSOR) 50 MG tablet Take 1 tablet (50 mg total) by mouth 2 (two) times daily. 180 tablet 0  . potassium chloride SA (K-DUR,KLOR-CON) 20 MEQ tablet Take 1 tablet (20 mEq total) by mouth 2 (two) times daily. 60 tablet 5  . simvastatin (ZOCOR) 40 MG tablet Take 0.5 tablets (20 mg total) by mouth every evening. 45 tablet 0  . lisinopril (PRINIVIL,ZESTRIL) 40 MG tablet Take 0.5 tablets (20 mg total) by mouth daily. (Patient not taking: Reported on 01/25/2015) 15 tablet 11   No current facility-administered medications for this visit.    Allergies:    Review of patient's allergies indicates no known allergies.    Social History:  The patient  reports that he has never smoked. He does not have any smokeless tobacco history on file. He reports that he does not drink alcohol or use illicit drugs.   Family History:  The patient's family history includes Heart disease in his father and mother.    ROS:  Please see the history of present illness.   Otherwise, review of systems are positive for none.   All other systems are reviewed and negative.    PHYSICAL EXAM: VS:  BP 130/70 mmHg  Pulse 80  Ht  (1.727 m)  Wt 241 lb (109.317 kg)  BMI 36.65 kg/m2 , BMI Body mass index is 36.65 kg/(m^2). GEN: Well nourished, well developed, in no acute distress HEENT: normal Neck: no JVD, carotid bruits, or masses Cardiac: RRR; no murmurs, rubs, or gallops. Tense bilateral lower extremity edema although markedly improved. Respiratory:  clear to auscultation bilaterally, normal work of breathing GI: soft, nontender, nondistended, + BS MS: no deformity or atrophy Skin: warm and dry, no rash Neuro:  Strength and sensation are intact Psych: euthymic mood, full affect   EKG:  EKG is not ordered today.    Recent  Labs: 01/18/2015: ALT 14 01/22/2015: Pro B Natriuretic peptide (BNP) 1941.0*; TSH 3.83 01/25/2015: BUN 50*; Creatinine 1.68*; Potassium 4.7; Sodium 140    Lipid Panel    Component Value Date/Time   CHOL 106 01/18/2015 0902   TRIG 108.0 01/18/2015 0902   HDL 27.30* 01/18/2015 0902   CHOLHDL 4 01/18/2015 0902   VLDL 21.6 01/18/2015 0902   LDLCALC 57 01/18/2015 0902      Wt Readings from Last 3 Encounters:  01/31/15 241 lb (109.317 kg)  01/25/15 247 lb 6.4 oz (112.22 kg)  01/19/15 266 lb 3.2 oz (120.748 kg)      Other studies Reviewed: Additional studies/ records that were reviewed today include: .   ASSESSMENT AND PLAN:  1.  Acute on chronic combined systolic and diastolic heart failure with anasarca, slowly improving with  decrease in weight by another 7 pounds compared to one week ago. 2. Poor compliance with medical regimen. Not taking metolazone and furosemide every day as instructed. Has not had it yet today.   Current medicines are reviewed at length with the patient today.  The patient does not have concerns regarding medicines. He gets diarrhea and second stomach when he takes his diuretic too early in the day. I believe this has sent to do with his poor compliance.   The following changes have been made:  I have encouraged him to take the medications as prescribed.  Labs/ tests ordered today include: Basic metabolic panel today and on return in 10 days   Orders Placed This Encounter  Procedures  . Basic metabolic panel     Disposition:   FU with Mendel RyderH. Miriana Gaertner in 10 days   Signed, Lesleigh NoeSMITH III,Madalin Hughart W, MD  01/31/2015 3:21 PM    [facility] [address], [place], [place]  [address] Phone: [phone](336) [phone]; Fax: [phone]) [phone]

## 2015-02-02 ENCOUNTER — Telehealth: Payer: Self-pay | Admitting: Interventional Cardiology

## 2015-02-02 DIAGNOSIS — I5043 Acute on chronic combined systolic (congestive) and diastolic (congestive) heart failure: Secondary | ICD-10-CM

## 2015-02-05 MED ORDER — METOLAZONE 2.5 MG PO TABS: 2.5000 mg | ORAL_TABLET | ORAL | Status: DC

## 2015-02-05 MED ORDER — FUROSEMIDE 80 MG PO TABS: 80.0000 mg | ORAL_TABLET | Freq: Every day | ORAL | Status: DC

## 2015-02-05 MED ORDER — POTASSIUM CHLORIDE CRYS ER 20 MEQ PO TBCR: 20.0000 meq | EXTENDED_RELEASE_TABLET | Freq: Every day | ORAL | Status: DC

## 2015-02-05 MED ORDER — METOLAZONE 2.5 MG PO TABS: ORAL_TABLET | ORAL | Status: DC

## 2015-02-05 NOTE — Telephone Encounter (Signed)
-----   Message from Lyn RecordsHenry W Smith III, MD sent at 02/02/2015  7:30 PM EST ----- Decrease furosemide to 80mg  daily; decrease  metolazone to 2.5 mg M, W, F; decrease Kdur to 20 meq daily.

## 2015-02-05 NOTE — Telephone Encounter (Signed)
Pt aware of lab results and Dr.Smith's recommendation.Decrease furosemide to 80mg  daily; decrease metolazone to 2.5 mg M, W, F; decrease Kdur to 20 meq daily.

## 2015-02-06 ENCOUNTER — Ambulatory Visit (INDEPENDENT_AMBULATORY_CARE_PROVIDER_SITE_OTHER): Payer: Commercial Managed Care - HMO | Admitting: Interventional Cardiology

## 2015-02-06 ENCOUNTER — Encounter: Payer: Self-pay | Admitting: Interventional Cardiology

## 2015-02-06 VITALS — BP 108/68 | HR 67 | Ht 68.0 in | Wt 239.1 lb

## 2015-02-06 DIAGNOSIS — I10 Essential (primary) hypertension: Secondary | ICD-10-CM

## 2015-02-06 DIAGNOSIS — I25118 Atherosclerotic heart disease of native coronary artery with other forms of angina pectoris: Secondary | ICD-10-CM

## 2015-02-06 DIAGNOSIS — E785 Hyperlipidemia, unspecified: Secondary | ICD-10-CM

## 2015-02-06 DIAGNOSIS — I255 Ischemic cardiomyopathy: Secondary | ICD-10-CM

## 2015-02-06 LAB — BASIC METABOLIC PANEL
BUN: 53 mg/dL — AB (ref 6–23)
CHLORIDE: 104 meq/L (ref 96–112)
CO2: 33 mEq/L — ABNORMAL HIGH (ref 19–32)
Calcium: 8.3 mg/dL — ABNORMAL LOW (ref 8.4–10.5)
Creatinine, Ser: 1.83 mg/dL — ABNORMAL HIGH (ref 0.40–1.50)
GFR: 39.38 mL/min — ABNORMAL LOW (ref >60.00)
Glucose, Bld: 153 mg/dL — ABNORMAL HIGH (ref 70–99)
Potassium: 4 mEq/L (ref 3.5–5.1)
Sodium: 141 mEq/L (ref 135–145)

## 2015-02-06 NOTE — Progress Notes (Signed)
Cardiology Office Note   Date:  02/06/2015   ID:  Hayden Patellalbert Redlich, DOB 06/12/1946, MRN [id number]  PCP:  Orland PenmanJARALLA SHAMLEFFER, IBTEHAL, MD  Cardiologist:   Lesleigh NoeSMITH III,Grayling Schranz W, MD   No chief complaint on file.     History of Present Illness: Hayden Wallace is a 69 y.o. male who presents for chronic systolic heart failure. He has been decreased to a less aggressive diuretic regimen due to diuresis. Appetite has improved.    Past Medical History  Diagnosis Date  . Coronary artery disease   . Acute MI     x2  . Hypertension   . Diabetes mellitus without complication   . Hyperlipidemia   . Hypercholesteremia   . Allergic rhinitis     Past Surgical History  Procedure Laterality Date  . Coronary artery bypass graft      multi-vessel 2004  . Coronary angioplasty with stent placement       Current Outpatient Prescriptions  Medication Sig Dispense Refill  . aspirin EC 81 MG tablet Take 81 mg by mouth daily.    . furosemide (LASIX) 80 MG tablet Take 1 tablet (80 mg total) by mouth daily.    Marland Kitchen. glipiZIDE (GLUCOTROL XL) 10 MG 24 hr tablet Take 10 mg by mouth 2 (two) times daily. Take 2 tablets twice daily    . JANUVIA 100 MG tablet Take 100 mg by mouth daily.     . metolazone (ZAROXOLYN) 2.5 MG tablet Take 1 tablet (2.5 mg total) by mouth every Monday, Wednesday, and Friday. Take one tablet by mouth once daily 30 minutes prior to lasix (furosemide) every M,W,F    . metolazone (ZAROXOLYN) 2.5 MG tablet Take one tablet by mouth 30 minutes prior to lasix (furosemide) every Mon, Wed,Fri    . metoprolol (LOPRESSOR) 50 MG tablet Take 1 tablet (50 mg total) by mouth 2 (two) times daily. 180 tablet 0  . potassium chloride SA (K-DUR,KLOR-CON) 20 MEQ tablet Take 1 tablet (20 mEq total) by mouth daily.    . simvastatin (ZOCOR) 40 MG tablet Take 0.5 tablets (20 mg total) by mouth every evening. 45 tablet 0  . lisinopril (PRINIVIL,ZESTRIL) 40 MG tablet Take 0.5 tablets (20 mg total) by mouth  daily. (Patient not taking: Reported on 02/06/2015) 15 tablet 11   No current facility-administered medications for this visit.    Allergies:   Review of patient's allergies indicates no known allergies.    Social History:  The patient  reports that he has never smoked. He does not have any smokeless tobacco history on file. He reports that he does not drink alcohol or use illicit drugs.   Family History:  The patient's family history includes Heart disease in his father and mother.    ROS:  Please see the history of present illness.   Otherwise, review of systems are positive for improved blood sugars. Decreased caloric intake and sodium intake. I reminded him of volume restrictions as well.   All other systems are reviewed and negative.    PHYSICAL EXAM: VS:  BP 108/68 mmHg  Pulse 67  Ht 5\' 8"  (1.727 m)  Wt 239 lb 1.9 oz (108.464 kg)  BMI 36.37 kg/m2 , BMI Body mass index is 36.37 kg/(m^2). GEN: Well nourished, well developed, in no acute distress HEENT: normal Neck: no JVD, carotid bruits, or masses Cardiac: RRR; no murmurs, rubs, or gallop. There is 2+ to 3+ tense bilateral lower extremity edema, improved compared to previous.  Respiratory:  clear  to auscultation bilaterally, normal work of breathing GI: soft, nontender, nondistended, + BS MS: no deformity or atrophy Skin: warm and dry, no rash Neuro:  Strength and sensation are intact Psych: euthymic mood, full affect   EKG:  EKG is not ordered today.    Recent Labs: 01/18/2015: ALT 14 01/22/2015: Pro B Natriuretic peptide (BNP) 1941.0*; TSH 3.83 01/31/2015: BUN 60*; Creatinine 1.97*; Potassium 4.3; Sodium 139    Lipid Panel    Component Value Date/Time   CHOL 106 01/18/2015 0902   TRIG 108.0 01/18/2015 0902   HDL 27.30* 01/18/2015 0902   CHOLHDL 4 01/18/2015 0902   VLDL 21.6 01/18/2015 0902   LDLCALC 57 01/18/2015 0902      Wt Readings from Last 3 Encounters:  02/06/15 239 lb 1.9 oz (108.464 kg)  01/31/15 241  lb (109.317 kg)  01/25/15 247 lb 6.4 oz (112.22 kg)      Other studies Reviewed: Additional studies/ records that were reviewed today include: .    ASSESSMENT AND PLAN:  1.  Acute on chronic systolic and diastolic heart failure, gradually improving. 2.  Acute on chronic kidney injury with creat 1.97 . 3.  CAD without angina pectoris. 4. Hypertension, essential, controlled.   Current medicines are reviewed at length with the patient today.  The patient does not have concerns regarding medicines.  The following changes have been made:  no change  Labs/ tests ordered today include: BMET  No orders of the defined types were placed in this encounter.     Disposition:   FU with Mendel Ryder in 3 weeks   Signed, Lesleigh Noe, MD  02/06/2015 9:15 AM    [facility] [address] [place], [place], [place]  [address] Phone: [phone]; Fax: [phone]

## 2015-02-06 NOTE — Patient Instructions (Signed)
Your physician recommends that you continue on your current medications as directed. Please refer to the Current Medication list given to you today.  Lab Today: Bmet  Your physician recommends that you return for lab work on 02/28/15 (Bmet)  You have a follow up appointment scheduled on 02/28/15 @ 3pm

## 2015-02-09 ENCOUNTER — Telehealth: Payer: Self-pay | Admitting: *Deleted

## 2015-02-09 NOTE — Telephone Encounter (Signed)
-----   Message from Lesleigh NoeHenry W Smith III, MD sent at 02/07/2015  6:40 PM EST ----- Labs are improved compared with last week. Continue the same plan outlined during the OV.

## 2015-02-09 NOTE — Telephone Encounter (Signed)
Patient is aware that his lab work has improved since last week, and I instructed him to continue current meds. He stated verbal understanding

## 2015-02-09 NOTE — Telephone Encounter (Signed)
Left message for patient to call.   Needs to be given most recent lab work from Dr. Katrinka BlazingSmith

## 2015-02-09 NOTE — Telephone Encounter (Signed)
Follow Up          Pt returning Bethany's phone call. Please call back

## 2015-02-15 ENCOUNTER — Ambulatory Visit: Payer: Medicare HMO | Admitting: Interventional Cardiology

## 2015-02-23 ENCOUNTER — Other Ambulatory Visit: Payer: Self-pay | Admitting: Interventional Cardiology

## 2015-02-23 ENCOUNTER — Telehealth: Payer: Self-pay | Admitting: Interventional Cardiology

## 2015-02-23 DIAGNOSIS — I5043 Acute on chronic combined systolic (congestive) and diastolic (congestive) heart failure: Secondary | ICD-10-CM

## 2015-02-23 MED ORDER — METOLAZONE 2.5 MG PO TABS: 2.5000 mg | ORAL_TABLET | ORAL | Status: DC

## 2015-02-23 MED ORDER — METOLAZONE 2.5 MG PO TABS: ORAL_TABLET | ORAL | Status: DC

## 2015-02-23 NOTE — Telephone Encounter (Signed)
Continue metolazone as directed. We don't plan to discontinue.

## 2015-02-23 NOTE — Telephone Encounter (Signed)
New message    Patient states he will call on Monday  . No issues going on at this time.    Metolazone 2.5 mg one has one pill left. Does he need to continue medication or discontinue .

## 2015-02-23 NOTE — Telephone Encounter (Signed)
Called patient back and assure him that metolazone will be continued. Sent order for Metolazone to [facility] to hold patient until [facility] come in. Patient verbalized understanding.

## 2015-02-23 NOTE — Telephone Encounter (Signed)
Patient wants to know if he needs to continue Metolazone 2.5 mg. Will forward to Dr. Katrinka BlazingSmith for refill request.

## 2015-02-28 ENCOUNTER — Encounter: Payer: Self-pay | Admitting: Interventional Cardiology

## 2015-02-28 ENCOUNTER — Ambulatory Visit (INDEPENDENT_AMBULATORY_CARE_PROVIDER_SITE_OTHER): Payer: Commercial Managed Care - HMO | Admitting: Interventional Cardiology

## 2015-02-28 VITALS — BP 146/74 | HR 60 | Ht 68.0 in | Wt 233.0 lb

## 2015-02-28 DIAGNOSIS — I255 Ischemic cardiomyopathy: Secondary | ICD-10-CM

## 2015-02-28 DIAGNOSIS — I25118 Atherosclerotic heart disease of native coronary artery with other forms of angina pectoris: Secondary | ICD-10-CM

## 2015-02-28 DIAGNOSIS — N183 Chronic kidney disease, stage 3 unspecified: Secondary | ICD-10-CM

## 2015-02-28 DIAGNOSIS — I5043 Acute on chronic combined systolic (congestive) and diastolic (congestive) heart failure: Secondary | ICD-10-CM

## 2015-02-28 DIAGNOSIS — I10 Essential (primary) hypertension: Secondary | ICD-10-CM

## 2015-02-28 LAB — BASIC METABOLIC PANEL
BUN: 46 mg/dL — ABNORMAL HIGH (ref 6–23)
CALCIUM: 8.4 mg/dL (ref 8.4–10.5)
CO2: 33 mEq/L — ABNORMAL HIGH (ref 19–32)
CREATININE: 1.76 mg/dL — AB (ref 0.40–1.50)
Chloride: 103 mEq/L (ref 96–112)
GFR: 41.18 mL/min — AB (ref >60.00)
GLUCOSE: 126 mg/dL — AB (ref 70–99)
Potassium: 4.5 mEq/L (ref 3.5–5.1)
Sodium: 138 mEq/L (ref 135–145)

## 2015-02-28 NOTE — Patient Instructions (Signed)
Your physician recommends that you continue on your current medications as directed. Please refer to the Current Medication list given to you today.  Lab Today: Bmet  You have a follow up appointment scheduled on 04/11/15 @ 11:45am

## 2015-02-28 NOTE — Progress Notes (Signed)
Cardiology Office Note   Date:  02/28/2015   ID:  Hayden Wallace, DOB [birth year], MRN [id number]  PCP:  Clelia Schaumann, MD  Cardiologist:   Lesleigh Noe, MD   No chief complaint on file.     History of Present Illness: Hayden Wallace is a 69 y.o. male who presents for anasarca and a/c diastolic heart failure. He is off the lisinopril. He denies chest pain. His legs are less swollen.    Past Medical History  Diagnosis Date  . Coronary artery disease   . Acute MI     x2  . Hypertension   . Diabetes mellitus without complication   . Hyperlipidemia   . Hypercholesteremia   . Allergic rhinitis     Past Surgical History  Procedure Laterality Date  . Coronary artery bypass graft      multi-vessel 2004  . Coronary angioplasty with stent placement       Current Outpatient Prescriptions  Medication Sig Dispense Refill  . aspirin EC 81 MG tablet Take 81 mg by mouth daily.    . furosemide (LASIX) 80 MG tablet Take 1 tablet (80 mg total) by mouth daily.    Marland Kitchen glipiZIDE (GLUCOTROL XL) 10 MG 24 hr tablet Take 10 mg by mouth 2 (two) times daily. Take 2 tablets twice daily    . JANUVIA 100 MG tablet Take 100 mg by mouth daily.     Marland Kitchen lisinopril (PRINIVIL,ZESTRIL) 40 MG tablet Take 0.5 tablets (20 mg total) by mouth daily. 15 tablet 11  . metolazone (ZAROXOLYN) 2.5 MG tablet Take one tablet by mouth 30 minutes prior to taking lasix (furosemide)  on every Monday Wednesday and Friday 36 tablet 3  . metoprolol (LOPRESSOR) 50 MG tablet TAKE 1 TABLET TWICE DAILY 180 tablet 0  . potassium chloride SA (K-DUR,KLOR-CON) 20 MEQ tablet Take 1 tablet (20 mEq total) by mouth daily.    . simvastatin (ZOCOR) 40 MG tablet TAKE 1/2 TABLET EVERY EVENING (NEED MD APPOINTMENT) 45 tablet 0   No current facility-administered medications for this visit.    Allergies:   Review of patient's allergies indicates no known allergies.    Social History:  The patient  reports that he has never  smoked. He does not have any smokeless tobacco history on file. He reports that he does not drink alcohol or use illicit drugs.   Family History:  The patient's family history includes Heart disease in his father and mother.    ROS:  Please see the history of present illness.   Otherwise, review of systems are positive for frequent urination on diuretic therapy..   All other systems are reviewed and negative.    PHYSICAL EXAM: VS:  BP 146/74 mmHg  Pulse 60  Ht  (1.727 m)  Wt 233 lb (105.688 kg)  BMI 35.44 kg/m2 , BMI Body mass index is 35.44 kg/(m^2). GEN: Well nourished, well developed, in no acute distress HEENT: normal Neck: Marked CV wave with elevated JVD while sitting. No Carotid bruits, or masses Cardiac: RRR; no murmurs, rubs, or gallops,no edema . Respiratory:  clear to auscultation bilaterally, normal work of breathing GI: soft, nontender, nondistended, + BS MS: no deformity or atrophy Skin: warm and dry, no rash Neuro:  Strength and sensation are intact Psych: euthymic mood, full affect   EKG:  EKG is not ordered today.    Recent Labs: 01/18/2015: ALT 14 01/22/2015: Pro B Natriuretic peptide (BNP) 1941.0*; TSH 3.83 02/06/2015: BUN 53*; Creatinine  1.83*; Potassium 4.0; Sodium 141    Lipid Panel    Component Value Date/Time   CHOL 106 01/18/2015 0902   TRIG 108.0 01/18/2015 0902   HDL 27.30* 01/18/2015 0902   CHOLHDL 4 01/18/2015 0902   VLDL 21.6 01/18/2015 0902   LDLCALC 57 01/18/2015 0902      Wt Readings from Last 3 Encounters:  02/28/15 233 lb (105.688 kg)  02/06/15 239 lb 1.9 oz (108.464 kg)  01/31/15 241 lb (109.317 kg)      Other studies Reviewed: Additional studies/ records that were reviewed today include: .    ASSESSMENT AND PLAN:  Acute on chronic combined systolic and diastolic : Continue diuresis and improved volume removal.   Coronary artery disease with other forms of angina pectoris: No angina   Ischemic cardiomyopathy:  stable    Essential hypertension: Mild orthostasis noted today   CKD (chronic kidney disease), stage III: Will check laboratory data data rule out progressive renal insufficiency     Current medicines are reviewed at length with the patient today.  The patient has concerns regarding medicines.  The following changes have been made:  We will [facility]  Labs/ tests ordered today include:   Orders Placed This Encounter  Procedures  . Basic metabolic panel     Disposition:   Hayden Wallace in 6 weeks   Signed, Lesleigh NoeSMITH III,HENRY W, MD  02/28/2015 3:43 PM    [facility] [address], [place], [place]  [address] Phone: [phone](336) [phone]; Fax: [phone]) [phone]

## 2015-03-01 ENCOUNTER — Telehealth: Payer: Self-pay

## 2015-03-01 NOTE — Telephone Encounter (Signed)
Follow up ° ° ° ° ° °Returning Lisa's call °

## 2015-03-01 NOTE — Telephone Encounter (Signed)
Pt aware of lab results and Dr.Smith's recommendation. Take metolazone every day for one week then go back to M, W, and Friday Recheck BMET on return appointment Pt verbalized understanding.

## 2015-03-01 NOTE — Telephone Encounter (Signed)
called to give lab results and Dr.Smith's recommendation.lmtcb

## 2015-03-12 ENCOUNTER — Other Ambulatory Visit: Payer: Self-pay

## 2015-03-12 DIAGNOSIS — I5043 Acute on chronic combined systolic (congestive) and diastolic (congestive) heart failure: Secondary | ICD-10-CM

## 2015-03-12 MED ORDER — POTASSIUM CHLORIDE CRYS ER 20 MEQ PO TBCR: 20.0000 meq | EXTENDED_RELEASE_TABLET | Freq: Every day | ORAL | Status: DC

## 2015-04-11 ENCOUNTER — Encounter: Payer: Self-pay | Admitting: Interventional Cardiology

## 2015-04-11 ENCOUNTER — Ambulatory Visit (INDEPENDENT_AMBULATORY_CARE_PROVIDER_SITE_OTHER): Payer: Commercial Managed Care - HMO | Admitting: Interventional Cardiology

## 2015-04-11 VITALS — BP 122/64 | HR 59 | Ht 68.0 in | Wt 222.4 lb

## 2015-04-11 DIAGNOSIS — I5043 Acute on chronic combined systolic (congestive) and diastolic (congestive) heart failure: Secondary | ICD-10-CM

## 2015-04-11 DIAGNOSIS — N183 Chronic kidney disease, stage 3 unspecified: Secondary | ICD-10-CM

## 2015-04-11 DIAGNOSIS — I5042 Chronic combined systolic (congestive) and diastolic (congestive) heart failure: Secondary | ICD-10-CM

## 2015-04-11 DIAGNOSIS — I25111 Atherosclerotic heart disease of native coronary artery with angina pectoris with documented spasm: Secondary | ICD-10-CM | POA: Diagnosis not present

## 2015-04-11 DIAGNOSIS — I10 Essential (primary) hypertension: Secondary | ICD-10-CM

## 2015-04-11 LAB — BASIC METABOLIC PANEL
BUN: 59 mg/dL — ABNORMAL HIGH (ref 6–23)
CO2: 28 mEq/L (ref 19–32)
Calcium: 9 mg/dL (ref 8.4–10.5)
Chloride: 107 mEq/L (ref 96–112)
Creatinine, Ser: 2.29 mg/dL — ABNORMAL HIGH (ref 0.40–1.50)
GFR: 30.38 mL/min — ABNORMAL LOW (ref >60.00)
GLUCOSE: 117 mg/dL — AB (ref 70–99)
Potassium: 4.4 mEq/L (ref 3.5–5.1)
Sodium: 140 mEq/L (ref 135–145)

## 2015-04-11 NOTE — Patient Instructions (Addendum)
Medication Instructions:  Your physician recommends that you continue on your current medications as directed. Please refer to the Current Medication list given to you today.   Labwork: Bmet today  Testing/Procedures: None   Follow-Up: Your physician recommends that you schedule a follow-up appointment in: 2-3 months   Any Other Special Instructions Will Be Listed Below (If Applicable).

## 2015-04-11 NOTE — Progress Notes (Signed)
Cardiology Office Note   Date:  04/11/2015   ID:  [name], DOB 1946/06/23, MRN [id number]  PCP:  Clelia Schaumann, MD  Cardiologist:   Lesleigh Noe, MD   Chief Complaint  Patient presents with  . Congestive Heart Failure      History of Present Illness: Hayden Wallace is a 69 y.o. male who presents for chronic ischemic heart disease with chronic combined systolic and diastolic heart failure, chronic kidney disease stage III, prior coronary bypass grafting, hypertension, and diabetes mellitus  He is very happy with the progress he is made in terms of shedding edema fluid from his legs and abdomen. He has lost an additional 11 pounds since his office visit 6 weeks ago. He still has some orthostatic dizziness. He denies orthopnea. He has not had angina.  Past Medical History  Diagnosis Date  . Coronary artery disease   . Acute MI     x2  . Hypertension   . Diabetes mellitus without complication   . Hyperlipidemia   . Hypercholesteremia   . Allergic rhinitis     Past Surgical History  Procedure Laterality Date  . Coronary artery bypass graft      multi-vessel 2004  . Coronary angioplasty with stent placement       Current Outpatient Prescriptions  Medication Sig Dispense Refill  . aspirin EC 81 MG tablet Take 81 mg by mouth daily.    . furosemide (LASIX) 80 MG tablet Take 1 tablet (80 mg total) by mouth daily.    Marland Kitchen glipiZIDE (GLUCOTROL XL) 10 MG 24 hr tablet Take 10 mg by mouth 2 (two) times daily. Take 2 tablets twice daily    . JANUVIA 100 MG tablet Take 100 mg by mouth daily.     Marland Kitchen lisinopril (PRINIVIL,ZESTRIL) 40 MG tablet Take 0.5 tablets (20 mg total) by mouth daily. 15 tablet 11  . metolazone (ZAROXOLYN) 2.5 MG tablet Take one tablet by mouth 30 minutes prior to taking lasix (furosemide)  on every Monday Wednesday and Friday 36 tablet 3  . metoprolol (LOPRESSOR) 50 MG tablet TAKE 1 TABLET TWICE DAILY 180 tablet 0  . potassium chloride SA  (K-DUR,KLOR-CON) 20 MEQ tablet Take 1 tablet (20 mEq total) by mouth daily. 90 tablet 3  . simvastatin (ZOCOR) 40 MG tablet TAKE 1/2 TABLET EVERY EVENING (NEED MD APPOINTMENT) 45 tablet 0   No current facility-administered medications for this visit.    Allergies:   Review of patient's allergies indicates no known allergies.    Social History:  The patient  reports that he has never smoked. He does not have any smokeless tobacco history on file. He reports that he does not drink alcohol or use illicit drugs.   Family History:  The patient's family history includes Heart disease in his father and mother.    ROS:  Please see the history of present illness.   Otherwise, review of systems are positive for occasional cramping.   All other systems are reviewed and negative.    PHYSICAL EXAM: VS:  BP 122/64 mmHg  Pulse 59  Ht  (1.727 m)  Wt 222 lb 6.4 oz (100.88 kg)  BMI 33.82 kg/m2  SpO2 98% , BMI Body mass index is 33.82 kg/(m^2). GEN: Well nourished, well developed, in no acute distress HEENT: normal Neck: no JVD, carotid bruits, or masses Cardiac: RRR; no murmurs, rubs, or gallops. There is 2-3+ edema., bilateral. Respiratory:  clear to auscultation bilaterally, normal work of breathing  GI: soft, nontender, nondistended, + BS MS: no deformity or atrophy Skin: warm and dry, no rash Neuro:  Strength and sensation are intact Psych: euthymic mood, full affect   EKG:  EKG is not ordered today.    Recent Labs: 01/18/2015: ALT 14 01/22/2015: Pro B Natriuretic peptide (BNP) 1941.0*; TSH 3.83 02/28/2015: BUN 46*; Creatinine 1.76*; Potassium 4.5; Sodium 138    Lipid Panel    Component Value Date/Time   CHOL 106 01/18/2015 0902   TRIG 108.0 01/18/2015 0902   HDL 27.30* 01/18/2015 0902   CHOLHDL 4 01/18/2015 0902   VLDL 21.6 01/18/2015 0902   LDLCALC 57 01/18/2015 0902      Wt Readings from Last 3 Encounters:  04/11/15 222 lb 6.4 oz (100.88 kg)  02/28/15 233 lb (105.688  kg)  02/06/15 239 lb 1.9 oz (108.464 kg)      Other studies Reviewed: Additional studies/ records that were reviewed today include: .    ASSESSMENT AND PLAN:  Chronic combined systolic and diastolic heart failure - markedly improved and now in a chronic state of heart failure with almost all of the anasarca fluid removed via diuresis with combination proximal and distal tubular diuretic regimen.  Coronary artery disease involving native coronary artery of native heart with no angina pectoris.  Essential hypertension: Controlled  CKD (chronic kidney disease), stage III: Current status not known     Current medicines are reviewed at length with the patient today.  The patient does not have concerns regarding medicines.  The following changes have been made:  Maintain same medical regimen unless dictated otherwise by the laboratory data  Labs/ tests ordered today include:   Orders Placed This Encounter  Procedures  . Basic metabolic panel     Disposition:   FU with Mendel RyderH. Smith  in 2 months  Signed, Lesleigh NoeSMITH III,HENRY W, MD  04/11/2015 12:33 PM    [facility] [address], [place], [place]  [address] Phone: [phone](336) [phone]; Fax: [phone](336) [phone]

## 2015-04-16 ENCOUNTER — Telehealth: Payer: Self-pay

## 2015-04-16 DIAGNOSIS — I5042 Chronic combined systolic (congestive) and diastolic (congestive) heart failure: Secondary | ICD-10-CM

## 2015-04-16 NOTE — Telephone Encounter (Signed)
-----   Message from Lyn RecordsHenry W Smith, MD sent at 04/14/2015  1:29 AM EDT ----- Stop metolazone and recheck BMET 2 weeks

## 2015-04-16 NOTE — Telephone Encounter (Signed)
Pt aware of lab results and Dr.Smith recommendations. Stop metolazone and recheck BMET 2 weeks Pt reports that he has been accidentally taking Metolazone every day. Adv to STOP Metolazone. He has not been weigh himself. Adv pt he should weigh daily under the same circumstances. lab appt scheduled for 5/16 (Bmet) Pt adv to call the office for weight .3lbs in a day, 5lbs in a wk. Adv pt I will update Dr.Smith and call back if he has any additional recommendations. Pt verbalized understanding.

## 2015-04-27 ENCOUNTER — Other Ambulatory Visit: Payer: Self-pay | Admitting: Interventional Cardiology

## 2015-04-30 ENCOUNTER — Other Ambulatory Visit: Payer: Commercial Managed Care - HMO

## 2015-05-01 ENCOUNTER — Telehealth: Payer: Self-pay | Admitting: Interventional Cardiology

## 2015-05-01 DIAGNOSIS — I5042 Chronic combined systolic (congestive) and diastolic (congestive) heart failure: Secondary | ICD-10-CM

## 2015-05-01 DIAGNOSIS — I5043 Acute on chronic combined systolic (congestive) and diastolic (congestive) heart failure: Secondary | ICD-10-CM

## 2015-05-01 NOTE — Telephone Encounter (Signed)
Pt calling re about his meds-because of kidney function he had to stop using some of it-retaining fluid-pls advise  Cell # [phone]

## 2015-05-01 NOTE — Telephone Encounter (Signed)
Returned pt call. Pt sts that he has had a 12lb-13lb weight gain since his o/v on 5/2. Pt was instructed to stop Metolazone due to an abnormal kidney function Pt sts that he was confused stopped furosemide also. Pt was to have a repeat bmet on 5/16, he forget about the lab appt. Pt reports some swelling in his LE, he denies sob. Adv him to resume his lasix 80mg  qd, lab appt rescheduled for 5/18. Adv pt I will fwd and update to Dr.Smith. And call back with his recommendation.

## 2015-05-02 ENCOUNTER — Other Ambulatory Visit (INDEPENDENT_AMBULATORY_CARE_PROVIDER_SITE_OTHER): Payer: Commercial Managed Care - HMO | Admitting: *Deleted

## 2015-05-02 DIAGNOSIS — I5042 Chronic combined systolic (congestive) and diastolic (congestive) heart failure: Secondary | ICD-10-CM

## 2015-05-02 LAB — BASIC METABOLIC PANEL
BUN: 57 mg/dL — AB (ref 6–23)
CALCIUM: 8.4 mg/dL (ref 8.4–10.5)
CO2: 21 mEq/L (ref 19–32)
Chloride: 111 mEq/L (ref 96–112)
Creatinine, Ser: 2.3 mg/dL — ABNORMAL HIGH (ref 0.40–1.50)
GFR: 30.23 mL/min — ABNORMAL LOW (ref >60.00)
GLUCOSE: 136 mg/dL — AB (ref 70–99)
POTASSIUM: 5.4 meq/L — AB (ref 3.5–5.1)
Sodium: 138 mEq/L (ref 135–145)

## 2015-05-03 MED ORDER — FUROSEMIDE 80 MG PO TABS: 120.0000 mg | ORAL_TABLET | Freq: Every day | ORAL | Status: AC

## 2015-05-03 NOTE — Telephone Encounter (Signed)
-----   Message from Lyn RecordsHenry W Smith, MD sent at 05/02/2015  3:28 PM EDT ----- Have him to discontinue the potassium supplement for the time being. Continue furosemide but increased to 120 mg daily. He needs a basic metabolic panel in one week.

## 2015-05-03 NOTE — Telephone Encounter (Signed)
Pt aware of lab results and Dr.Smith's recommendation. discontinue the potassium supplement for the time being. Continue furosemide but increased to 120 mg daily. He needs a basic metabolic panel in one week.  Pt sts that his swelling has improved since resuming Lasix. Pt aware lab appt for bmet sch for 5/27 Pt verbalized understanding to med change, and repeated instructions back to me.

## 2015-05-11 ENCOUNTER — Other Ambulatory Visit: Payer: Commercial Managed Care - HMO

## 2015-05-18 ENCOUNTER — Other Ambulatory Visit (INDEPENDENT_AMBULATORY_CARE_PROVIDER_SITE_OTHER): Payer: Commercial Managed Care - HMO | Admitting: *Deleted

## 2015-05-18 DIAGNOSIS — I5042 Chronic combined systolic (congestive) and diastolic (congestive) heart failure: Secondary | ICD-10-CM

## 2015-05-18 LAB — BASIC METABOLIC PANEL
BUN: 53 mg/dL — AB (ref 6–23)
CALCIUM: 8.4 mg/dL (ref 8.4–10.5)
CO2: 26 meq/L (ref 19–32)
CREATININE: 2.3 mg/dL — AB (ref 0.40–1.50)
Chloride: 107 mEq/L (ref 96–112)
GFR: 30.22 mL/min — ABNORMAL LOW (ref >60.00)
GLUCOSE: 130 mg/dL — AB (ref 70–99)
Potassium: 4.2 mEq/L (ref 3.5–5.1)
Sodium: 139 mEq/L (ref 135–145)

## 2015-05-18 NOTE — Addendum Note (Signed)
Addended by: Tonita PhoenixBOWDEN, Keyosha Tiedt K on: 05/18/2015 09:29 AM   Modules accepted: Orders

## 2015-05-21 ENCOUNTER — Telehealth: Payer: Self-pay

## 2015-05-21 NOTE — Telephone Encounter (Signed)
Pt aware of lab results and Dr.Smith's recommendations. Labs are stable compared to 2 weeks ago. Potassium is normal. Continue same therapy. BMET 4 weeks Pt verbalized understanding.

## 2015-05-21 NOTE — Telephone Encounter (Signed)
-----   Message from Lyn RecordsHenry W Smith, MD sent at 05/18/2015  5:41 PM EDT ----- Labs are stable compared to 2 weeks ago. Potassium is normal. Continue same therapy. BMET 4 weeks

## 2015-05-30 ENCOUNTER — Encounter (HOSPITAL_COMMUNITY): Payer: Self-pay

## 2015-05-30 ENCOUNTER — Emergency Department (HOSPITAL_COMMUNITY)
Admission: EM | Admit: 2015-05-30 | Discharge: 2015-06-15 | Disposition: E | Payer: Commercial Managed Care - HMO | Attending: Emergency Medicine | Admitting: Emergency Medicine

## 2015-05-30 DIAGNOSIS — Z7982 Long term (current) use of aspirin: Secondary | ICD-10-CM | POA: Diagnosis not present

## 2015-05-30 DIAGNOSIS — I10 Essential (primary) hypertension: Secondary | ICD-10-CM | POA: Diagnosis not present

## 2015-05-30 DIAGNOSIS — E119 Type 2 diabetes mellitus without complications: Secondary | ICD-10-CM | POA: Insufficient documentation

## 2015-05-30 DIAGNOSIS — Z8709 Personal history of other diseases of the respiratory system: Secondary | ICD-10-CM | POA: Insufficient documentation

## 2015-05-30 DIAGNOSIS — Z951 Presence of aortocoronary bypass graft: Secondary | ICD-10-CM | POA: Insufficient documentation

## 2015-05-30 DIAGNOSIS — Z79899 Other long term (current) drug therapy: Secondary | ICD-10-CM | POA: Diagnosis not present

## 2015-05-30 DIAGNOSIS — E785 Hyperlipidemia, unspecified: Secondary | ICD-10-CM | POA: Diagnosis not present

## 2015-05-30 DIAGNOSIS — I2510 Atherosclerotic heart disease of native coronary artery without angina pectoris: Secondary | ICD-10-CM | POA: Insufficient documentation

## 2015-05-30 DIAGNOSIS — I469 Cardiac arrest, cause unspecified: Secondary | ICD-10-CM | POA: Diagnosis present

## 2015-05-30 DIAGNOSIS — Z9861 Coronary angioplasty status: Secondary | ICD-10-CM | POA: Diagnosis not present

## 2015-05-30 DIAGNOSIS — I252 Old myocardial infarction: Secondary | ICD-10-CM | POA: Insufficient documentation

## 2015-05-30 MED ORDER — EPINEPHRINE HCL 0.1 MG/ML IJ SOSY
PREFILLED_SYRINGE | INTRAMUSCULAR | Status: AC | PRN
  Administered 2015-05-30 (×3): 1 mg via INTRAVENOUS

## 2015-05-30 MED ORDER — SODIUM BICARBONATE 8.4 % IV SOLN
INTRAVENOUS | Status: AC | PRN
  Administered 2015-05-30: 50 meq via INTRAVENOUS

## 2015-05-30 MED ORDER — CALCIUM CHLORIDE 10 % IV SOLN
INTRAVENOUS | Status: AC | PRN
  Administered 2015-05-30: 1 g via INTRAVENOUS

## 2015-05-31 MED FILL — Medication: Qty: 1 | Status: AC

## 2015-06-15 NOTE — Progress Notes (Signed)
   June 02, 2015 1930  Clinical Encounter Type  Visited With Other (Comment)  Visit Type Death  Spiritual Encounters  Spiritual Needs Emotional;Grief support  Met with common-law spouse (15 yrs), and friend with MD to inform of death of PT; offered grief and spiritual support as needed; refreshments and aided with final goodbye.

## 2015-06-15 NOTE — ED Notes (Signed)
See Code Narrator

## 2015-06-15 NOTE — ED Notes (Signed)
Chaplain at bedside

## 2015-06-15 NOTE — Code Documentation (Signed)
No cardiac activity noted on ultra sound

## 2015-06-15 NOTE — ED Notes (Signed)
Family at bedside. 

## 2015-06-15 NOTE — Code Documentation (Addendum)
Pt here with [facility]- collapsed at restaurant at 1810. CPR started immediately by bystander. 7 EPI en route, 1 dextrose. PEA the entire time. King airway in place. HX of DM and MI.

## 2015-06-15 NOTE — ED Provider Notes (Signed)
CSN: [id number]     Arrival date & time 2015/06/10  1847 History   First MD Initiated Contact with Patient 10-Jun-2015 1916     Chief Complaint  Patient presents with  . Cardiac Arrest     (Consider location/radiation/quality/duration/timing/severity/associated sxs/prior Treatment) HPI  69 year old male with a history of prior MIs, diabetes, hypertension, hyperlipidemia who presents as a PEA arrest. Per EMS. Patient was at a restaurant and got up to go to the bathroom. He had a witnessed syncopal event and bystander CPR initiated. On upon EMS arrival, initial rhythm was PEA. CPR was continued using mechanical device. Patient given total of 7 doses of epinephrine. Time of incident approximately 35 minutes prior to arrival.   Past Medical History  Diagnosis Date  . Coronary artery disease   . Acute MI     x2  . Hypertension   . Diabetes mellitus without complication   . Hyperlipidemia   . Hypercholesteremia   . Allergic rhinitis    Past Surgical History  Procedure Laterality Date  . Coronary artery bypass graft      multi-vessel 2004  . Coronary angioplasty with stent placement     Family History  Problem Relation Age of Onset  . Heart disease Mother   . Heart disease Father    History  Substance Use Topics  . Smoking status: Never Smoker   . Smokeless tobacco: Not on file  . Alcohol Use: No    Review of Systems  Unable to perform ROS     Allergies  Review of patient's allergies indicates no known allergies.  Home Medications   Prior to Admission medications   Medication Sig Start Date End Date Taking? Authorizing Provider  aspirin EC 81 MG tablet Take 81 mg by mouth daily.    Historical Provider, MD  furosemide (LASIX) 80 MG tablet Take 1.5 tablets (120 mg total) by mouth daily. 05/03/15   Lyn Records, MD  glipiZIDE (GLUCOTROL XL) 10 MG 24 hr tablet Take 10 mg by mouth 2 (two) times daily. Take 2 tablets twice daily 07/28/14   Historical Provider, MD  JANUVIA 100  MG tablet Take 100 mg by mouth daily.  07/28/14   Historical Provider, MD  lisinopril (PRINIVIL,ZESTRIL) 40 MG tablet Take 0.5 tablets (20 mg total) by mouth daily. 01/19/14   Lyn Records, MD  metoprolol (LOPRESSOR) 50 MG tablet TAKE 1 TABLET TWICE DAILY 04/27/15   Lyn Records, MD  simvastatin (ZOCOR) 40 MG tablet TAKE 1/2 TABLET EVERY EVENING (NEED MD APPOINTMENT) 04/27/15   Lyn Records, MD   Wt 222 lb (100.699 kg) Physical Exam  Constitutional: He appears well-developed and well-nourished. He is intubated [facility] airway in place with equal bilateral breath sounds).  HENT:  Head: Normocephalic and atraumatic.  Right Ear: External ear normal.  Left Ear: External ear normal.  Eyes:  Eyes dilated at 4 mm and fixed. No corneal reflex  Cardiovascular:  No pulses  Pulmonary/Chest: He is intubated [facility] airway in place with equal bilateral breath sounds).  Abdominal: He exhibits no distension.  Musculoskeletal: He exhibits edema (bilateral lower extremity edema, looks chronic).  Neurological: He is unresponsive.  Skin: No abrasion and no rash noted. He is not diaphoretic.    ED Course  Procedures (including critical care time) Labs Review Labs Reviewed  I-STAT CHEM 8, ED  I-STAT CG4 LACTIC ACID, ED  I-STAT TROPOININ, ED    Imaging Review No results found.   EKG Interpretation None  MDM   Upon arrival. CPR was in progress. He continued to be in PEA arrest. Patient given 3 additional doses of epinephrine, doses of bicarbonate and calcium chloride given. Additional rounds of CPR were performed however were unable to obtain ROSC. No organized electrical activity noted on ultrasound. After unsuccessful attempts at resuscitating the patient he was pronounced dead at 1858. Family was brought in and notified and identified the patient's body. Dr Merlene Laughter from [facility] (Pt's PCP clinic) was contacted and will take care of the death  certificate.   Patient seen in conjunction with Dr. Fredderick Phenix.  Deniece Portela, M.D. Resident   Final diagnoses:  Cardiac arrest        Drema Pry, MD 05/31/15 0092  Rolan Bucco, MD 06/02/15 [phone]

## 2015-06-15 NOTE — Code Documentation (Signed)
Patient time of death occurred at 43.

## 2015-06-15 NOTE — Code Documentation (Signed)
Ultrasound used to check heart motion.

## 2015-06-15 DEATH — deceased

## 2015-06-26 ENCOUNTER — Ambulatory Visit: Payer: Commercial Managed Care - HMO | Admitting: Interventional Cardiology
